# Patient Record
Sex: Female | Born: 1995 | Race: White | Hispanic: No | Marital: Single | Smoking: Never smoker
Health system: Southern US, Community
[De-identification: ages and names within clinical notes are randomized; demographics above are authoritative.]

## PROBLEM LIST (undated history)

## (undated) DIAGNOSIS — K219 Gastro-esophageal reflux disease without esophagitis: Secondary | ICD-10-CM

## (undated) DIAGNOSIS — F419 Anxiety disorder, unspecified: Secondary | ICD-10-CM

## (undated) DIAGNOSIS — F32A Depression, unspecified: Secondary | ICD-10-CM

## (undated) DIAGNOSIS — R519 Headache, unspecified: Secondary | ICD-10-CM

## (undated) DIAGNOSIS — F329 Major depressive disorder, single episode, unspecified: Secondary | ICD-10-CM

## (undated) DIAGNOSIS — R7303 Prediabetes: Secondary | ICD-10-CM

## (undated) DIAGNOSIS — R51 Headache: Secondary | ICD-10-CM

## (undated) HISTORY — DX: Prediabetes: R73.03

---
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
## 2017-08-05 ENCOUNTER — Emergency Department (HOSPITAL_BASED_OUTPATIENT_CLINIC_OR_DEPARTMENT_OTHER)
Admission: EM | Admit: 2017-08-05 | Discharge: 2017-08-05 | Disposition: A | Discharge status: Home / Self Care | Payer: Managed Care, Other (non HMO) | Attending: Emergency Medicine | Admitting: Emergency Medicine

## 2017-08-05 ENCOUNTER — Emergency Department (HOSPITAL_BASED_OUTPATIENT_CLINIC_OR_DEPARTMENT_OTHER): Payer: Managed Care, Other (non HMO)

## 2017-08-05 ENCOUNTER — Encounter (HOSPITAL_BASED_OUTPATIENT_CLINIC_OR_DEPARTMENT_OTHER): Payer: Self-pay | Admitting: Emergency Medicine

## 2017-08-05 ENCOUNTER — Other Ambulatory Visit: Payer: Self-pay

## 2017-08-05 DIAGNOSIS — Z79899 Other long term (current) drug therapy: Secondary | ICD-10-CM | POA: Diagnosis not present

## 2017-08-05 DIAGNOSIS — R1031 Right lower quadrant pain: Secondary | ICD-10-CM | POA: Diagnosis present

## 2017-08-05 DIAGNOSIS — F419 Anxiety disorder, unspecified: Secondary | ICD-10-CM

## 2017-08-05 HISTORY — DX: Depression, unspecified: F32.A

## 2017-08-05 HISTORY — DX: Anxiety disorder, unspecified: F41.9

## 2017-08-05 HISTORY — DX: Major depressive disorder, single episode, unspecified: F32.9

## 2017-08-05 LAB — URINALYSIS, ROUTINE W REFLEX MICROSCOPIC
Bilirubin Urine: NEGATIVE
GLUCOSE, UA: NEGATIVE mg/dL
HGB URINE DIPSTICK: NEGATIVE
Ketones, ur: NEGATIVE mg/dL
Leukocytes, UA: NEGATIVE
Nitrite: NEGATIVE
PROTEIN: NEGATIVE mg/dL
Specific Gravity, Urine: >1.03 — ABNORMAL HIGH (ref 1.005–1.030)
pH: 5.5 (ref 5.0–8.0)

## 2017-08-05 LAB — CBC WITH DIFFERENTIAL/PLATELET
Basophils Absolute: 0 10*3/uL (ref 0.0–0.1)
Basophils Relative: 0 %
Eosinophils Absolute: 0.1 10*3/uL (ref 0.0–0.7)
Eosinophils Relative: 1 %
HEMATOCRIT: 41.3 % (ref 36.0–46.0)
HEMOGLOBIN: 14 g/dL (ref 12.0–15.0)
LYMPHS ABS: 2.8 10*3/uL (ref 0.7–4.0)
LYMPHS PCT: 33 %
MCH: 30.3 pg (ref 26.0–34.0)
MCHC: 33.9 g/dL (ref 30.0–36.0)
MCV: 89.4 fL (ref 78.0–100.0)
MONOS PCT: 6 %
Monocytes Absolute: 0.5 10*3/uL (ref 0.1–1.0)
Neutro Abs: 5 10*3/uL (ref 1.7–7.7)
Neutrophils Relative %: 60 %
Platelets: 299 10*3/uL (ref 150–400)
RBC: 4.62 MIL/uL (ref 3.87–5.11)
RDW: 13.1 % (ref 11.5–15.5)
WBC: 8.4 10*3/uL (ref 4.0–10.5)

## 2017-08-05 LAB — COMPREHENSIVE METABOLIC PANEL
ALBUMIN: 4 g/dL (ref 3.5–5.0)
ALK PHOS: 98 U/L (ref 38–126)
ALT: 21 U/L (ref 0–44)
AST: 24 U/L (ref 15–41)
Anion gap: 9 (ref 5–15)
BUN: 8 mg/dL (ref 6–20)
CALCIUM: 8.9 mg/dL (ref 8.9–10.3)
CO2: 27 mmol/L (ref 22–32)
CREATININE: 0.62 mg/dL (ref 0.44–1.00)
Chloride: 102 mmol/L (ref 98–111)
GFR calc Af Amer: >60 mL/min (ref >60)
GFR calc non Af Amer: >60 mL/min (ref >60)
GLUCOSE: 98 mg/dL (ref 70–99)
POTASSIUM: 3.7 mmol/L (ref 3.5–5.1)
Sodium: 138 mmol/L (ref 135–145)
TOTAL PROTEIN: 7.7 g/dL (ref 6.5–8.1)
Total Bilirubin: 0.3 mg/dL (ref 0.3–1.2)

## 2017-08-05 LAB — LIPASE, BLOOD: Lipase: 31 U/L (ref 11–51)

## 2017-08-05 MED ORDER — DICYCLOMINE HCL 20 MG PO TABS: 20.0000 mg | ORAL_TABLET | Freq: Two times a day (BID) | ORAL | 0 refills | Status: DC

## 2017-08-05 MED ORDER — DIPHENHYDRAMINE HCL 50 MG/ML IJ SOLN
25.0000 mg | Freq: Once | INTRAMUSCULAR | Status: AC
  Administered 2017-08-05: 25 mg via INTRAVENOUS
  Filled 2017-08-05: qty 1

## 2017-08-05 MED ORDER — IOPAMIDOL (ISOVUE-300) INJECTION 61%
100.0000 mL | Freq: Once | INTRAVENOUS | Status: AC | PRN
  Administered 2017-08-05: 100 mL via INTRAVENOUS

## 2017-08-05 MED ORDER — ONDANSETRON 4 MG PO TBDP: 4.0000 mg | ORAL_TABLET | Freq: Three times a day (TID) | ORAL | 0 refills | Status: DC | PRN

## 2017-08-05 NOTE — ED Provider Notes (Signed)
[facility] EMERGENCY DEPARTMENT Provider Note   CSN: [id number] Arrival date & time: 08/05/17  1808     History   Chief Complaint Chief Complaint  Patient presents with  . Abdominal Pain    HPI Susan Brennan is a 22 y.o. female.  22 y/o female sent in by her PCP with a chief complaint of abdominal pain that began 2 days ago. Patient describes this pain as sharp mainly on the RLQ. She also reports some nausea, diarrhea and some anorexia. Patient states she's been eating less, last meal reported was 1pm. She states she's had diarrhea the last couple of days. Patient has tried ibuprfen for the pain but has had no relieve in symptoms. She denies any chest pain, shortness of breath, urinary or gynecological complaints.      Past Medical History:  Diagnosis Date  . Anxiety   . Depression   . HA (headache)     Patient Active Problem List   Diagnosis Date Noted  . Anxiety     No past surgical history on file.   OB History   None      Home Medications    Prior to Admission medications   Medication Sig Start Date End Date Taking? Authorizing Provider  cholecalciferol (VITAMIN D) 400 units TABS tablet Take 400 Units by mouth.   Yes [provider]  DULoxetine (CYMBALTA) 30 MG capsule Take 30 mg by mouth daily.   Yes [provider]  LORazepam (ATIVAN) 1 MG tablet Take 1 mg by mouth 2 (two) times daily.   Yes [provider]  zolpidem (AMBIEN) 10 MG tablet Take 10 mg by mouth at bedtime as needed for sleep.   Yes [provider]    Family History No family history on file.  Social History Social History   Tobacco Use  . Smoking status: Never Smoker  . Smokeless tobacco: Never Used  Substance Use Topics  . Alcohol use: No  . Drug use: No     Allergies   Patient has no known allergies.   Review of Systems Review of Systems  Constitutional: Negative for chills and fever.  HENT: Negative for rhinorrhea and  sore throat.   Respiratory: Negative for shortness of breath and wheezing.   Cardiovascular: Negative for chest pain and palpitations.  Gastrointestinal: Positive for abdominal pain, diarrhea and nausea. Negative for constipation and vomiting.  Genitourinary: Negative for dysuria and flank pain.  Musculoskeletal: Negative for back pain.  Skin: Negative for color change and rash.  Neurological: Negative for weakness and light-headedness.  All other systems reviewed and are negative.    Physical Exam Updated Vital Signs BP (!) 114/94 (BP Location: Left Arm)   Pulse (!) 103   Temp 98.3 F (36.8 C) (Oral)   Resp 20   Ht 5\' 8"  (1.727 m)   Wt (!) 169.6 kg (374 lb)   LMP 06/15/2017   SpO2 100%   BMI 56.87 kg/m   Physical Exam  Constitutional: She appears well-developed and well-nourished.  HENT:  Head: Normocephalic and atraumatic.  Eyes: Pupils are equal, round, and reactive to light.  Cardiovascular: Normal rate.  Pulmonary/Chest: Effort normal and breath sounds normal. She has no wheezes.  Abdominal: Soft. Normal appearance. She exhibits no distension. Bowel sounds are decreased. There is tenderness in the right lower quadrant and periumbilical area. There is tenderness at McBurney's point. There is no rigidity, no guarding, no CVA tenderness and negative Murphy's sign.    (+)  psoas sign  Skin: Skin is warm and dry.  Nursing note and vitals reviewed.    ED Treatments / Results  Labs (all labs ordered are listed, but only abnormal results are displayed) Labs Reviewed  COMPREHENSIVE METABOLIC PANEL  URINALYSIS, ROUTINE W REFLEX MICROSCOPIC    EKG None  Radiology No results found.  Procedures Procedures (including critical care time)  Medications Ordered in ED Medications - No data to display   Initial Impression / Assessment and Plan / ED Course  I have reviewed the triage vital signs and the nursing notes.  Pertinent labs & imaging results that were  available during my care of the patient were reviewed by me and considered in my medical decision making (see chart for details).     Patient sent from PCP for evaluation of appendicitis.Laboratory results revealed no leukocytosis.UA showed no infection. Lipase returned 31,I believe any gallbladder pathology is less likely patient not complaining of pain the RUQ.I have ordered a CT ABD &Pelvis to evaluate for appendicitis.   8:03 PM I have spoke to Dr. Gray Bernhardt about this patient, she has seen this patient at this time care has been transferred.   Final Clinical Impressions(s) / ED Diagnoses   Final diagnoses:  Right lower quadrant abdominal pain    ED Discharge Orders    None       Freddy Jaksch 08/05/17 2004    Jacalyn Lefevre, MD 08/05/17 2129

## 2017-08-05 NOTE — ED Notes (Signed)
Patient co generalized itching; no rash noted; no sob noted; nad noted; edp aware.

## 2017-08-05 NOTE — ED Triage Notes (Signed)
RLQ abdominal pain for several days.  No appetite.  Some nausea, vomited scant amount.  Some diarrhea.  Seen at Dr. Pablo LawrenceFried's office and was sent here for evaluation.  Pt had negative pregnancy test at office.  No other testing done.

## 2018-02-04 ENCOUNTER — Other Ambulatory Visit (HOSPITAL_COMMUNITY): Payer: Self-pay | Admitting: Psychiatry

## 2018-03-15 ENCOUNTER — Ambulatory Visit: Payer: Managed Care, Other (non HMO)

## 2018-03-15 ENCOUNTER — Encounter: Payer: Managed Care, Other (non HMO) | Admitting: Podiatry

## 2018-03-15 DIAGNOSIS — M722 Plantar fascial fibromatosis: Secondary | ICD-10-CM

## 2018-03-15 NOTE — Progress Notes (Signed)
This encounter was created in error - please disregard.

## 2018-03-24 ENCOUNTER — Ambulatory Visit: Payer: Managed Care, Other (non HMO) | Admitting: Podiatry

## 2018-04-11 ENCOUNTER — Other Ambulatory Visit: Payer: Self-pay

## 2018-04-11 ENCOUNTER — Observation Stay (HOSPITAL_BASED_OUTPATIENT_CLINIC_OR_DEPARTMENT_OTHER)
Admission: EM | Admit: 2018-04-11 | Discharge: 2018-04-13 | Disposition: A | Discharge status: Home / Self Care | Payer: Managed Care, Other (non HMO) | Attending: General Surgery | Admitting: General Surgery

## 2018-04-11 ENCOUNTER — Encounter (HOSPITAL_BASED_OUTPATIENT_CLINIC_OR_DEPARTMENT_OTHER): Payer: Self-pay | Admitting: Emergency Medicine

## 2018-04-11 ENCOUNTER — Emergency Department (HOSPITAL_BASED_OUTPATIENT_CLINIC_OR_DEPARTMENT_OTHER): Payer: Managed Care, Other (non HMO)

## 2018-04-11 DIAGNOSIS — R109 Unspecified abdominal pain: Secondary | ICD-10-CM

## 2018-04-11 DIAGNOSIS — Z79899 Other long term (current) drug therapy: Secondary | ICD-10-CM | POA: Diagnosis not present

## 2018-04-11 DIAGNOSIS — Z7984 Long term (current) use of oral hypoglycemic drugs: Secondary | ICD-10-CM | POA: Insufficient documentation

## 2018-04-11 DIAGNOSIS — F419 Anxiety disorder, unspecified: Secondary | ICD-10-CM | POA: Diagnosis not present

## 2018-04-11 DIAGNOSIS — K819 Cholecystitis, unspecified: Secondary | ICD-10-CM

## 2018-04-11 DIAGNOSIS — F329 Major depressive disorder, single episode, unspecified: Secondary | ICD-10-CM | POA: Diagnosis not present

## 2018-04-11 DIAGNOSIS — Z6841 Body Mass Index (BMI) 40.0 and over, adult: Secondary | ICD-10-CM | POA: Diagnosis not present

## 2018-04-11 DIAGNOSIS — K8010 Calculus of gallbladder with chronic cholecystitis without obstruction: Principal | ICD-10-CM | POA: Diagnosis present

## 2018-04-11 LAB — CBC WITH DIFFERENTIAL/PLATELET
Abs Immature Granulocytes: 0.01 10*3/uL (ref 0.00–0.07)
Basophils Absolute: 0 10*3/uL (ref 0.0–0.1)
Basophils Relative: 1 %
Eosinophils Absolute: 0.1 10*3/uL (ref 0.0–0.5)
Eosinophils Relative: 2 %
HEMATOCRIT: 42.1 % (ref 36.0–46.0)
Hemoglobin: 13.5 g/dL (ref 12.0–15.0)
Immature Granulocytes: 0 %
LYMPHS ABS: 2.6 10*3/uL (ref 0.7–4.0)
Lymphocytes Relative: 40 %
MCH: 29.7 pg (ref 26.0–34.0)
MCHC: 32.1 g/dL (ref 30.0–36.0)
MCV: 92.7 fL (ref 80.0–100.0)
Monocytes Absolute: 0.4 10*3/uL (ref 0.1–1.0)
Monocytes Relative: 5 %
Neutro Abs: 3.4 10*3/uL (ref 1.7–7.7)
Neutrophils Relative %: 52 %
Platelets: 286 10*3/uL (ref 150–400)
RBC: 4.54 MIL/uL (ref 3.87–5.11)
RDW: 12.6 % (ref 11.5–15.5)
WBC: 6.5 10*3/uL (ref 4.0–10.5)
nRBC: 0 % (ref 0.0–0.2)

## 2018-04-11 LAB — URINALYSIS, MICROSCOPIC (REFLEX)

## 2018-04-11 LAB — URINALYSIS, ROUTINE W REFLEX MICROSCOPIC
Bilirubin Urine: NEGATIVE
Glucose, UA: NEGATIVE mg/dL
Hgb urine dipstick: NEGATIVE
Ketones, ur: NEGATIVE mg/dL
Nitrite: NEGATIVE
Protein, ur: NEGATIVE mg/dL
Specific Gravity, Urine: 1.015 (ref 1.005–1.030)
pH: 6 (ref 5.0–8.0)

## 2018-04-11 LAB — COMPREHENSIVE METABOLIC PANEL
ALBUMIN: 3.8 g/dL (ref 3.5–5.0)
ALT: 23 U/L (ref 0–44)
AST: 23 U/L (ref 15–41)
Alkaline Phosphatase: 88 U/L (ref 38–126)
Anion gap: 6 (ref 5–15)
BUN: 12 mg/dL (ref 6–20)
CO2: 27 mmol/L (ref 22–32)
CREATININE: 0.69 mg/dL (ref 0.44–1.00)
Calcium: 9.4 mg/dL (ref 8.9–10.3)
Chloride: 104 mmol/L (ref 98–111)
GFR calc Af Amer: >60 mL/min (ref >60)
GFR calc non Af Amer: >60 mL/min (ref >60)
Glucose, Bld: 97 mg/dL (ref 70–99)
Potassium: 3.7 mmol/L (ref 3.5–5.1)
Sodium: 137 mmol/L (ref 135–145)
Total Bilirubin: 0.2 mg/dL — ABNORMAL LOW (ref 0.3–1.2)
Total Protein: 7.2 g/dL (ref 6.5–8.1)

## 2018-04-11 LAB — LIPASE, BLOOD: Lipase: 36 U/L (ref 11–51)

## 2018-04-11 LAB — PREGNANCY, URINE: Preg Test, Ur: NEGATIVE

## 2018-04-11 MED ORDER — ALUM & MAG HYDROXIDE-SIMETH 200-200-20 MG/5ML PO SUSP
30.0000 mL | Freq: Once | ORAL | Status: AC
  Administered 2018-04-11: 30 mL via ORAL
  Filled 2018-04-11: qty 30

## 2018-04-11 MED ORDER — SODIUM CHLORIDE 0.9 % IV BOLUS
1000.0000 mL | Freq: Once | INTRAVENOUS | Status: AC
  Administered 2018-04-11: 1000 mL via INTRAVENOUS

## 2018-04-11 MED ORDER — METFORMIN HCL ER 750 MG PO TB24
750.0000 mg | ORAL_TABLET | Freq: Every day | ORAL | Status: DC
  Administered 2018-04-13: 750 mg via ORAL
  Filled 2018-04-11 (×2): qty 1

## 2018-04-11 MED ORDER — SODIUM CHLORIDE 0.9 % IV SOLN
2.0000 g | Freq: Once | INTRAVENOUS | Status: AC
  Administered 2018-04-11: 2 g via INTRAVENOUS
  Filled 2018-04-11: qty 20

## 2018-04-11 MED ORDER — ONDANSETRON 4 MG PO TBDP
4.0000 mg | ORAL_TABLET | Freq: Three times a day (TID) | ORAL | Status: DC | PRN
  Administered 2018-04-11 – 2018-04-13 (×4): 4 mg via ORAL
  Filled 2018-04-11 (×4): qty 1

## 2018-04-11 MED ORDER — TRAMADOL HCL 50 MG PO TABS
50.0000 mg | ORAL_TABLET | Freq: Four times a day (QID) | ORAL | Status: DC | PRN
  Administered 2018-04-11: 50 mg via ORAL
  Filled 2018-04-11: qty 1

## 2018-04-11 MED ORDER — MORPHINE SULFATE (PF) 2 MG/ML IV SOLN
1.0000 mg | INTRAVENOUS | Status: DC | PRN
  Administered 2018-04-12: 1 mg via INTRAVENOUS
  Administered 2018-04-12 (×2): 2 mg via INTRAVENOUS
  Filled 2018-04-11 (×4): qty 1

## 2018-04-11 MED ORDER — POTASSIUM CHLORIDE IN NACL 20-0.45 MEQ/L-% IV SOLN
INTRAVENOUS | Status: DC
  Administered 2018-04-11 – 2018-04-12 (×3): via INTRAVENOUS
  Filled 2018-04-11 (×3): qty 1000

## 2018-04-11 MED ORDER — HYDROCODONE-ACETAMINOPHEN 5-325 MG PO TABS: 1.0000 | ORAL_TABLET | ORAL | Status: DC | PRN

## 2018-04-11 MED ORDER — ENOXAPARIN SODIUM 40 MG/0.4ML ~~LOC~~ SOLN
40.0000 mg | SUBCUTANEOUS | Status: DC
  Administered 2018-04-11: 40 mg via SUBCUTANEOUS
  Filled 2018-04-11: qty 0.4

## 2018-04-11 MED ORDER — DULOXETINE HCL 30 MG PO CPEP
30.0000 mg | ORAL_CAPSULE | Freq: Every day | ORAL | Status: DC
  Administered 2018-04-11 – 2018-04-13 (×3): 30 mg via ORAL
  Filled 2018-04-11 (×3): qty 1

## 2018-04-11 MED ORDER — ZOLPIDEM TARTRATE 5 MG PO TABS: 5.0000 mg | ORAL_TABLET | Freq: Every evening | ORAL | Status: DC | PRN

## 2018-04-11 MED ORDER — ZOLPIDEM TARTRATE 10 MG PO TABS: 5.0000 mg | ORAL_TABLET | Freq: Every evening | ORAL | Status: DC | PRN

## 2018-04-11 MED ORDER — LIDOCAINE VISCOUS HCL 2 % MT SOLN
15.0000 mL | Freq: Once | OROMUCOSAL | Status: AC
  Administered 2018-04-11: 15 mL via ORAL
  Filled 2018-04-11: qty 15

## 2018-04-11 MED ORDER — SODIUM CHLORIDE 0.9 % IV SOLN
2.0000 g | INTRAVENOUS | Status: DC
  Administered 2018-04-12: 2 g via INTRAVENOUS
  Filled 2018-04-11: qty 2

## 2018-04-11 NOTE — H&P (Addendum)
Re:   Susan Brennan DOB:   November 08, 1995 MRN:   [id number]  Chief Complaint Abdominal pain  ASSESEMENT AND PLAN: 1.  Cholecystitis, cholelithiasis  I discussed with the patient the indications and risks of gall bladder surgery.  The primary risks of gall bladder surgery include, but are not limited to, bleeding, infection, common bile duct injury, and open surgery.  There is also the risk that the patient may have continued symptoms after surgery.  We discussed the typical post-operative recovery course. I tried to answer the patient's questions.  I gave the patient literature about gall bladder surgery.  Plan admission, IVF, antibiotics, and surgery.  Dr. Sheliah Hatch is our surgeon of the week and will decide on the timing of surgery tomorrow.  2.  Anxiety/Depression 3.  Right foot issues - not defined 4.  Morbid obesty  Weight 382, BMI - 58.1  She is on Adipex - but not sure how much that has helped.  Chief Complaint  Patient presents with  . Abdominal Pain   PHYSICIAN REQUESTING CONSULTATION:  Lyndel Safe, PA, [facility]  HISTORY OF PRESENT ILLNESS: Susan Brennan is a 23 y.o. (DOB: [birth year])  white female whose primary care physician is Brennan, Susan Cella, [employer] .  Her brother, Susan Brennan, is in the room with her.   The patient has had some vague reflux, nausea, epigastric pain going on for about 1 week.  Over the counter medication has not helped this.  She said about 3 years ago she was evaluated for possible gallbladder disease by gastroenterologist, but her evaluation at that time was negative.  She does not remember his name. (Dr. Tiburcio Pea was the ordering physician in 2016 for an [place]) She has had no prior stomach, liver, pancreas, or colon disease.  She has had no prior abdominal surgery.  She has not spoken to her PCP about her symptoms.  She had a CT scan of the abdomen on 08/05/2017 which was negative. She had an abdominal US on 04/11/2018 which showed  moderate cholelithiasis with borderline wall thickening and suggestion of positive sonographic Murphy sign.   Past Medical History:  Diagnosis Date  . Anxiety   . Depression   . HA (headache)      History reviewed. No pertinent surgical history.    Current Facility-Administered Medications  Medication Dose Route Frequency Provider Last Rate Last Dose  . cefTRIAXone (ROCEPHIN) 2 g in sodium chloride 0.9 % 100 mL IVPB  2 g Intravenous Once Cristina Gong, PA-C   Stopped at 04/11/18 1400   Current Outpatient Medications  Medication Sig Dispense Refill  . DULoxetine (CYMBALTA) 30 MG capsule Take 30 mg by mouth daily.    . metFORMIN (GLUCOPHAGE-XR) 750 MG 24 hr tablet     . zolpidem (AMBIEN) 10 MG tablet Take 10 mg by mouth at bedtime as needed for sleep.    . cholecalciferol (VITAMIN D) 400 units TABS tablet Take 400 Units by mouth.    . dicyclomine (BENTYL) 20 MG tablet Take 1 tablet (20 mg total) by mouth 2 (two) times daily. 20 tablet 0  . LORazepam (ATIVAN) 1 MG tablet Take 1 mg by mouth 2 (two) times daily.    . medroxyPROGESTERone (PROVERA) 5 MG tablet     . ondansetron (ZOFRAN ODT) 4 MG disintegrating tablet Take 1 tablet (4 mg total) by mouth every 8 (eight) hours as needed. 10 tablet 0  . phentermine (ADIPEX-P) 37.5 MG tablet TAKE 1 TABLET BY MOUTH  EVERY DAY IN THE MORNING        Allergies  Allergen Reactions  . Contrast Media [Iodinated Diagnostic Agents] Itching and Rash    REVIEW OF SYSTEMS: Skin:  No history of rash.  No history of abnormal moles. Infection:  No history of hepatitis or HIV.  No history of MRSA. Neurologic:  No history of stroke.  No history of seizure.  No history of headaches. Cardiac:  No history of hypertension. No history of heart disease.  No history of seeing a cardiologist. Pulmonary:  Does not smoke cigarettes.  No asthma or bronchitis.  No OSA/CPAP.  Endocrine:  No diabetes. She said that her thyroid test were negative.  Gastrointestinal:  See HPI Musculoskeletal:  No history of joint or back disease. Hematologic:  No bleeding disorder.  No history of anemia.  Not anticoagulated. Psycho-social:  The patient is oriented.   She has a history of anxiety and depression.  She was seeing someone, but she cannot remember his name.  She is not seeing him now, but is thinking about going back.  SOCIAL and FAMILY HISTORY: Unmarried. She lives with her parents. She is engaged to [employer] (no date).       Her brother, Susan Brennan, is with her  PHYSICAL EXAM: BP (!) 118/50 (BP Location: Left Arm)   Pulse 97   Temp 97.6 F (36.4 C) (Oral)   Resp 19   Ht 5\' 8"  (1.727 m)   Wt (!) 173.3 kg   LMP 03/12/2018   SpO2 100%   BMI 58.08 kg/m   General: Morbidly obese WF who is alert and generally healthy appearing.  Skin:  Inspection and palpation - no mass or rash. Eyes:  Conjunctiva and lids unremarkable.            Pupils are equal Ears, Nose, Mouth, and Throat:  Ears and nose unremarkable            Lips and teeth are unremarable. Neck: Supple. No mass, trachea midline.  No thyroid mass. Lymph Nodes:  No supraclavicular, cervical, or inguinal nodes. Lungs: Normal respiratory effort.  Clear to auscultation and symmetric breath sounds. Heart:  Palpation of the heart is normal.            Auscultation: RRR. No murmur or rub.  Abdomen: Soft. No mass. No hernia. Normal bowel sounds.  No abdominal scars.  Obese.   Sore in RUQ quadrant without peritoneal sxes. Rectal: Not done. Musculoskeletal:  Normal gait.            Good muscle strength and ROM  in upper and lower extremities.  Neurologic:  Grossly intact to motor and sensory function. Psychiatric: Normal judgement and insight. Behavior is normal.            Oriented to time, person, place.   DATA REVIEWED, COUNSELING AND COORDINATION OF CARE: Epic notes reviewed. Counseling and coordination of care exceeded more than 50% of the time spent with  patient. Total time spent with patient and charting: 45 mintues  Ovidio Kin, MD,  [facility], [place] [address] [place]  [address]   [place], [place]    [address] Phone:  [phone] FAX:  [phone]

## 2018-04-11 NOTE — ED Triage Notes (Signed)
RUQ pain with nausea and diarrhea x 1 week. Sent from [facility]

## 2018-04-11 NOTE — ED Provider Notes (Signed)
  Physical Exam  BP (!) 118/50 (BP Location: Left Arm)   Pulse 97   Temp 97.6 F (36.4 C) (Oral)   Resp 19   Ht 5\' 8"  (1.727 m)   Wt (!) 173.3 kg   LMP 03/12/2018   SpO2 100%   BMI 58.08 kg/m   Physical Exam  GEN: Appears nontoxic Pulm: Clear lung sounds bilaterally CV: Regular rate rhythm Abdomen: Tenderness palpation of right upper quadrant without rigidity, guarding, distention.  Negative rebound.  ED Course/Procedures   Clinical Course as of Apr 10 1436  Sun Apr 11, 2018  1245 Spoke with on call surgery Dr. Ezzard Standing who requested ED to ED transfer so he can evaluate patient at [facility].   [EH]  1246 Spoke with Dr. Deretha Emory and [facility] emergency room who accepts patient for transfer.   [EH]    Clinical Course User Index [EH] Cristina Gong, PA-C    Procedures  MDM   Patient transferred from [facility] for cholecystitis.  Please see previous notes for further history.  In brief, patient presenting with 4 to 5 days of right upper quadrant abdominal pain, nausea, and heartburn.  Work-up at [facility] revealed cholecystitis on ultrasound.  Patient transferred to [facility] for surgical evaluation. Consult placed to Dr. Ezzard Standing with general surgery.  Pt to be admitted by gen surgery.        Susan Apley, PA-C 04/11/18 1513    Susan Mulders, MD 04/14/18 2043

## 2018-04-11 NOTE — ED Provider Notes (Signed)
[facility] EMERGENCY DEPARTMENT Provider Note   CSN: [id number] Arrival date & time: 04/11/18  1043    History   Chief Complaint Chief Complaint  Patient presents with  . Abdominal Pain    HPI Susan Brennan is a 23 y.o. female with a past medical history of high insulin levels, anxiety, depression, who presents today for evaluation of right upper quadrant pain.  She reports that for approximately 1 week she has had nausea vomiting and diarrhea with right upper quadrant pain.  She reports frequently feeling like she is regurgitating.  She has been trying OTC GERD medicines without significant relief.  She denies any fevers or coughs at home.  She says that this feels different from her usual heartburn.  She has never had any abdominal surgeries before.  She denies any dysuria, increased frequency or urgency.  No vaginal discharge, spotting, or abnormal bleeding.  She has vomited once yesterday, had 3 episodes of diarrhea yesterday.     HPI  Past Medical History:  Diagnosis Date  . Anxiety   . Depression   . HA (headache)     Patient Active Problem List   Diagnosis Date Noted  . Anxiety     History reviewed. No pertinent surgical history.   OB History   No obstetric history on file.      Home Medications    Prior to Admission medications   Medication Sig Start Date End Date Taking? Authorizing Provider  DULoxetine (CYMBALTA) 30 MG capsule Take 30 mg by mouth daily.   Yes [provider]  metFORMIN (GLUCOPHAGE-XR) 750 MG 24 hr tablet  12/26/17  Yes [provider]  zolpidem (AMBIEN) 10 MG tablet Take 10 mg by mouth at bedtime as needed for sleep.   Yes [provider]  cholecalciferol (VITAMIN D) 400 units TABS tablet Take 400 Units by mouth.    [provider]  dicyclomine (BENTYL) 20 MG tablet Take 1 tablet (20 mg total) by mouth 2 (two) times daily. 08/05/17   Jacalyn Lefevre, MD  LORazepam (ATIVAN) 1 MG tablet  Take 1 mg by mouth 2 (two) times daily.    [provider]  medroxyPROGESTERone (PROVERA) 5 MG tablet  10/23/17   [provider]  ondansetron (ZOFRAN ODT) 4 MG disintegrating tablet Take 1 tablet (4 mg total) by mouth every 8 (eight) hours as needed. 08/05/17   Jacalyn Lefevre, MD  phentermine (ADIPEX-P) 37.5 MG tablet TAKE 1 TABLET BY MOUTH EVERY DAY IN THE MORNING 10/30/17   [provider]    Family History No family history on file.  Social History Social History   Tobacco Use  . Smoking status: Never Smoker  . Smokeless tobacco: Never Used  Substance Use Topics  . Alcohol use: No  . Drug use: No     Allergies   Contrast media [iodinated diagnostic agents]   Review of Systems Review of Systems  Constitutional: Negative for chills and fever.  HENT: Negative for congestion.   Respiratory: Negative for chest tightness and shortness of breath.   Cardiovascular: Negative for chest pain.  Gastrointestinal: Positive for abdominal pain, diarrhea, nausea and vomiting.  Genitourinary: Negative for dysuria.  Musculoskeletal: Negative for back pain and neck pain.  Skin: Negative for color change, rash and wound.  Neurological: Negative for weakness and headaches.  All other systems reviewed and are negative.    Physical Exam Updated Vital Signs BP 118/74   Pulse 96   Temp 98 F (36.7  C) (Oral)   Resp 16   Ht 5\' 8"  (1.727 m)   Wt (!) 173.3 kg   LMP 03/12/2018   SpO2 100%   BMI 58.08 kg/m   Physical Exam Vitals signs and nursing note reviewed.  Constitutional:      General: She is not in acute distress.    Appearance: She is well-developed. She is obese.  HENT:     Head: Normocephalic and atraumatic.  Eyes:     Conjunctiva/sclera: Conjunctivae normal.  Neck:     Musculoskeletal: Neck supple.  Cardiovascular:     Rate and Rhythm: Normal rate and regular rhythm.     Heart sounds: Normal heart sounds. No murmur.  Pulmonary:     Effort:  Pulmonary effort is normal. No respiratory distress.     Breath sounds: Normal breath sounds.  Abdominal:     General: Bowel sounds are normal.     Palpations: Abdomen is soft.     Tenderness: There is abdominal tenderness (Worse in RUQ) in the right upper quadrant and epigastric area. There is no right CVA tenderness, left CVA tenderness, guarding or rebound. Positive signs include Murphy's sign. Negative signs include McBurney's sign.     Hernia: No hernia is present.  Skin:    General: Skin is warm and dry.  Neurological:     General: No focal deficit present.     Mental Status: She is alert.  Psychiatric:        Mood and Affect: Mood normal.        Behavior: Behavior normal.      ED Treatments / Results  Labs (all labs ordered are listed, but only abnormal results are displayed) Labs Reviewed  COMPREHENSIVE METABOLIC PANEL - Abnormal; Notable for the following components:      Result Value   Total Bilirubin 0.2 (*)    All other components within normal limits  URINALYSIS, ROUTINE W REFLEX MICROSCOPIC - Abnormal; Notable for the following components:   Leukocytes,Ua SMALL (*)    All other components within normal limits  URINALYSIS, MICROSCOPIC (REFLEX) - Abnormal; Notable for the following components:   Bacteria, UA MANY (*)    All other components within normal limits  LIPASE, BLOOD  CBC WITH DIFFERENTIAL/PLATELET  PREGNANCY, URINE    EKG EKG Interpretation  Date/Time:  Sunday April 11 2018 12:29:03 EDT Ventricular Rate:  88 PR Interval:    QRS Duration: 96 QT Interval:  353 QTC Calculation: 428 R Axis:   47 Text Interpretation:  Sinus rhythm Low voltage, precordial leads RSR' in V1 or V2, probably normal variant Borderline T abnormalities, inferior leads Confirmed by Benjiman Core [phone]) on 04/11/2018 1:37:18 PM   Radiology US Abdomen Limited Ruq  Result Date: 04/11/2018 CLINICAL DATA:  Right upper quadrant/epigastric pain with nausea and vomiting 1  week. EXAM: ULTRASOUND ABDOMEN LIMITED RIGHT UPPER QUADRANT COMPARISON:  CT 08/05/2017 FINDINGS: Gallbladder: Moderate cholelithiasis with borderline wall thickening measuring 3.2 mm. Largest stone 1 cm. No adjacent free fluid. Positive tenderness directly over the gallbladder while scanning suggesting positive sonographic Murphy's sign. Common bile duct: Diameter: 4 mm. Liver: No focal lesion identified. Within normal limits in parenchymal echogenicity. Portal vein is patent on color Doppler imaging with normal direction of blood flow towards the liver. IMPRESSION: Moderate cholelithiasis with borderline wall thickening and suggestion of positive sonographic Murphy sign. Findings may be due to acute cholecystitis. Electronically Signed   By: Elberta Fortis M.D.   On: 04/11/2018 12:12    Procedures Procedures (  including critical care time)  Medications Ordered in ED Medications  cefTRIAXone (ROCEPHIN) 2 g in sodium chloride 0.9 % 100 mL IVPB (has no administration in time range)  sodium chloride 0.9 % bolus 1,000 mL (0 mLs Intravenous Stopped 04/11/18 1329)  alum & mag hydroxide-simeth (MAALOX/MYLANTA) 200-200-20 MG/5ML suspension 30 mL (30 mLs Oral Given 04/11/18 1156)    And  lidocaine (XYLOCAINE) 2 % viscous mouth solution 15 mL (15 mLs Oral Given 04/11/18 1156)     Initial Impression / Assessment and Plan / ED Course  I have reviewed the triage vital signs and the nursing notes.  Pertinent labs & imaging results that were available during my care of the patient were reviewed by me and considered in my medical decision making (see chart for details).  Clinical Course as of Apr 10 1356  Sun Apr 11, 2018  1245 Spoke with on call surgery Dr. Ezzard StandingNewman who requested ED to ED transfer so [facility].   [EH]  1246 [facility] who [facility] patient for transfer.   [EH]    Clinical Course User Index [EH] Cristina GongHammond, Nil Bolser W, PA-C       Patient presents today for evaluation of approximately 1 week of abdominal pain with nausea vomiting and diarrhea.  On exam she is tender to palpation in the right upper quadrant.  Labs are obtained and reviewed without significant electrolyte or hematologic derangements.  Patient is not pregnant.  Her sound was obtained showing concern for cholecystitis with moderate cholelithiasis and borderline wall thickening of the gallbladder.  I spoke with Dr. Ezzard StandingNewman on-call [facility] who requested the patient be transferred ED to ED so that he may evaluate her.  I [facility] with [facility] who [facility] the [facility] in transfer.  Rocephin was ordered for her cholelithiasis.  She was offered morphine and other pain medicines however declined while in the department.  She remained hemodynamically stable while in the department.  [facility].  Final Clinical Impressions(s) / ED Diagnoses   Final diagnoses:  Abdominal pain  Cholecystitis    ED Discharge Orders    None       Norman ClayHammond, Deniese Oberry W, PA-C 04/11/18 1359    Benjiman CorePickering, Nathan, MD 04/11/18 [phone]

## 2018-04-11 NOTE — ED Notes (Signed)
Waiting on Dr Ezzard Standing for surgical consult

## 2018-04-11 NOTE — ED Notes (Signed)
Assisted to bathroom to provide specimen

## 2018-04-11 NOTE — ED Notes (Signed)
Report provided to Darl Pikes, RN charge at [facility], Report provided to [facility] for transport

## 2018-04-12 ENCOUNTER — Observation Stay (HOSPITAL_COMMUNITY): Payer: Managed Care, Other (non HMO) | Admitting: Anesthesiology

## 2018-04-12 ENCOUNTER — Encounter (HOSPITAL_COMMUNITY)
Admission: EM | Disposition: A | Discharge status: Home / Self Care | Payer: Self-pay | Source: Home / Self Care | Attending: Emergency Medicine

## 2018-04-12 HISTORY — PX: CHOLECYSTECTOMY: SHX55

## 2018-04-12 LAB — HIV ANTIBODY (ROUTINE TESTING W REFLEX): HIV Screen 4th Generation wRfx: NONREACTIVE

## 2018-04-12 SURGERY — LAPAROSCOPIC CHOLECYSTECTOMY WITH INTRAOPERATIVE CHOLANGIOGRAM
Anesthesia: General | Site: Abdomen

## 2018-04-12 MED ORDER — FENTANYL CITRATE (PF) 100 MCG/2ML IJ SOLN
INTRAMUSCULAR | Status: AC
  Filled 2018-04-12: qty 2

## 2018-04-12 MED ORDER — IBUPROFEN 800 MG PO TABS
800.0000 mg | ORAL_TABLET | Freq: Three times a day (TID) | ORAL | Status: DC | PRN
  Administered 2018-04-12 – 2018-04-13 (×2): 800 mg via ORAL
  Filled 2018-04-12 (×2): qty 1

## 2018-04-12 MED ORDER — LIDOCAINE 2% (20 MG/ML) 5 ML SYRINGE
INTRAMUSCULAR | Status: DC | PRN
  Administered 2018-04-12: 80 mg via INTRAVENOUS

## 2018-04-12 MED ORDER — PROPOFOL 10 MG/ML IV BOLUS
INTRAVENOUS | Status: DC | PRN
  Administered 2018-04-12: 300 mg via INTRAVENOUS

## 2018-04-12 MED ORDER — DEXMEDETOMIDINE HCL 200 MCG/2ML IV SOLN
INTRAVENOUS | Status: DC | PRN
  Administered 2018-04-12: 12 ug via INTRAVENOUS
  Administered 2018-04-12: 8 ug via INTRAVENOUS

## 2018-04-12 MED ORDER — SODIUM CHLORIDE 0.9 % IR SOLN
Status: DC | PRN
  Administered 2018-04-12: 1

## 2018-04-12 MED ORDER — LIDOCAINE 2% (20 MG/ML) 5 ML SYRINGE
INTRAMUSCULAR | Status: AC
  Filled 2018-04-12: qty 5

## 2018-04-12 MED ORDER — LACTATED RINGERS IV SOLN
INTRAVENOUS | Status: DC | PRN
  Administered 2018-04-12: 12:00:00 via INTRAVENOUS

## 2018-04-12 MED ORDER — MIDAZOLAM HCL 2 MG/2ML IJ SOLN
INTRAMUSCULAR | Status: DC | PRN
  Administered 2018-04-12: 2 mg via INTRAVENOUS

## 2018-04-12 MED ORDER — SUGAMMADEX SODIUM 500 MG/5ML IV SOLN
INTRAVENOUS | Status: AC
  Filled 2018-04-12: qty 5

## 2018-04-12 MED ORDER — SUCCINYLCHOLINE CHLORIDE 200 MG/10ML IV SOSY
PREFILLED_SYRINGE | INTRAVENOUS | Status: AC
  Filled 2018-04-12: qty 10

## 2018-04-12 MED ORDER — BUPIVACAINE-EPINEPHRINE 0.25% -1:200000 IJ SOLN
INTRAMUSCULAR | Status: DC | PRN
  Administered 2018-04-12: 30 mL

## 2018-04-12 MED ORDER — FENTANYL CITRATE (PF) 100 MCG/2ML IJ SOLN: 25.0000 ug | INTRAMUSCULAR | Status: DC | PRN

## 2018-04-12 MED ORDER — DEXAMETHASONE SODIUM PHOSPHATE 10 MG/ML IJ SOLN
INTRAMUSCULAR | Status: DC | PRN
  Administered 2018-04-12: 8 mg via INTRAVENOUS

## 2018-04-12 MED ORDER — FENTANYL CITRATE (PF) 250 MCG/5ML IJ SOLN
INTRAMUSCULAR | Status: DC | PRN
  Administered 2018-04-12 (×8): 50 ug via INTRAVENOUS

## 2018-04-12 MED ORDER — DEXAMETHASONE SODIUM PHOSPHATE 10 MG/ML IJ SOLN
INTRAMUSCULAR | Status: AC
  Filled 2018-04-12: qty 1

## 2018-04-12 MED ORDER — FENTANYL CITRATE (PF) 100 MCG/2ML IJ SOLN
25.0000 ug | INTRAMUSCULAR | Status: DC | PRN
  Administered 2018-04-12 (×3): 50 ug via INTRAVENOUS

## 2018-04-12 MED ORDER — ONDANSETRON HCL 4 MG/2ML IJ SOLN
INTRAMUSCULAR | Status: AC
  Filled 2018-04-12: qty 2

## 2018-04-12 MED ORDER — PROPOFOL 10 MG/ML IV BOLUS
INTRAVENOUS | Status: AC
  Filled 2018-04-12: qty 20

## 2018-04-12 MED ORDER — ROCURONIUM BROMIDE 100 MG/10ML IV SOLN
INTRAVENOUS | Status: AC
  Filled 2018-04-12: qty 1

## 2018-04-12 MED ORDER — ROCURONIUM BROMIDE 10 MG/ML (PF) SYRINGE
PREFILLED_SYRINGE | INTRAVENOUS | Status: DC | PRN
  Administered 2018-04-12: 40 mg via INTRAVENOUS
  Administered 2018-04-12: 20 mg via INTRAVENOUS

## 2018-04-12 MED ORDER — MIDAZOLAM HCL 2 MG/2ML IJ SOLN
INTRAMUSCULAR | Status: AC
  Filled 2018-04-12: qty 2

## 2018-04-12 MED ORDER — SUGAMMADEX SODIUM 200 MG/2ML IV SOLN
INTRAVENOUS | Status: DC | PRN
  Administered 2018-04-12: 350 mg via INTRAVENOUS

## 2018-04-12 MED ORDER — ONDANSETRON HCL 4 MG/2ML IJ SOLN
INTRAMUSCULAR | Status: DC | PRN
  Administered 2018-04-12 (×2): 4 mg via INTRAVENOUS

## 2018-04-12 MED ORDER — SUCCINYLCHOLINE CHLORIDE 200 MG/10ML IV SOSY
PREFILLED_SYRINGE | INTRAVENOUS | Status: DC | PRN
  Administered 2018-04-12: 140 mg via INTRAVENOUS

## 2018-04-12 SURGICAL SUPPLY — 47 items
APPLIER CLIP ROT 10 11.4 M/L (STAPLE)
BANDAGE ADH SHEER 1  50/CT (GAUZE/BANDAGES/DRESSINGS) ×12 IMPLANT
BENZOIN TINCTURE PRP APPL 2/3 (GAUZE/BANDAGES/DRESSINGS) ×3 IMPLANT
CABLE HIGH FREQUENCY MONO STRZ (ELECTRODE) ×3 IMPLANT
CATH CHOLANG 76X19 KUMAR (CATHETERS) ×3 IMPLANT
CHLORAPREP W/TINT 26ML (MISCELLANEOUS) ×3 IMPLANT
CLIP APPLIE ROT 10 11.4 M/L (STAPLE) IMPLANT
CLIP VESOLOCK LG 6/CT PURPLE (CLIP) IMPLANT
CLIP VESOLOCK MED LG 6/CT (CLIP) ×3 IMPLANT
CLOSURE WOUND 1/2 X4 (GAUZE/BANDAGES/DRESSINGS) ×1
COVER MAYO STAND STRL (DRAPES) ×3 IMPLANT
COVER SURGICAL LIGHT HANDLE (MISCELLANEOUS) ×3 IMPLANT
COVER WAND RF STERILE (DRAPES) ×3 IMPLANT
DECANTER SPIKE VIAL GLASS SM (MISCELLANEOUS) ×3 IMPLANT
DERMABOND ADVANCED (GAUZE/BANDAGES/DRESSINGS) ×2
DERMABOND ADVANCED .7 DNX12 (GAUZE/BANDAGES/DRESSINGS) ×1 IMPLANT
DRAIN CHANNEL 19F RND (DRAIN) IMPLANT
DRAPE C-ARM 42X120 X-RAY (DRAPES) IMPLANT
EVACUATOR SILICONE 100CC (DRAIN) IMPLANT
GLOVE BIOGEL M 6.5 STRL (GLOVE) ×3 IMPLANT
GLOVE BIOGEL M 8.0 STRL (GLOVE) ×3 IMPLANT
GLOVE BIOGEL PI IND STRL 6 (GLOVE) ×1 IMPLANT
GLOVE BIOGEL PI IND STRL 7.0 (GLOVE) ×2 IMPLANT
GLOVE BIOGEL PI INDICATOR 6 (GLOVE) ×2
GLOVE BIOGEL PI INDICATOR 7.0 (GLOVE) ×4
GLOVE SURG SS PI 7.0 STRL IVOR (GLOVE) ×3 IMPLANT
GOWN STRL REUS W/TWL LRG LVL3 (GOWN DISPOSABLE) ×3 IMPLANT
GOWN STRL REUS W/TWL XL LVL3 (GOWN DISPOSABLE) ×6 IMPLANT
GRASPER SUT TROCAR 14GX15 (MISCELLANEOUS) IMPLANT
KIT BASIN OR (CUSTOM PROCEDURE TRAY) ×3 IMPLANT
KIT TURNOVER KIT A (KITS) ×3 IMPLANT
POUCH RETRIEVAL ECOSAC 10 (ENDOMECHANICALS) ×1 IMPLANT
POUCH RETRIEVAL ECOSAC 10MM (ENDOMECHANICALS) ×2
SCISSORS LAP 5X35 DISP (ENDOMECHANICALS) ×3 IMPLANT
SET IRRIG TUBING LAPAROSCOPIC (IRRIGATION / IRRIGATOR) ×3 IMPLANT
SET TUBE SMOKE EVAC HIGH FLOW (TUBING) ×3 IMPLANT
SLEEVE XCEL OPT CAN 5 100 (ENDOMECHANICALS) ×6 IMPLANT
STOPCOCK 4 WAY LG BORE MALE ST (IV SETS) IMPLANT
STRIP CLOSURE SKIN 1/2X4 (GAUZE/BANDAGES/DRESSINGS) ×2 IMPLANT
SUT ETHILON 2 0 PS N (SUTURE) IMPLANT
SUT MNCRL AB 4-0 PS2 18 (SUTURE) ×3 IMPLANT
SUT VICRYL 0 ENDOLOOP (SUTURE) IMPLANT
TOWEL OR 17X26 10 PK STRL BLUE (TOWEL DISPOSABLE) ×3 IMPLANT
TOWEL OR NON WOVEN STRL DISP B (DISPOSABLE) IMPLANT
TRAY LAPAROSCOPIC (CUSTOM PROCEDURE TRAY) ×3 IMPLANT
TROCAR BLADELESS OPT 5 100 (ENDOMECHANICALS) ×3 IMPLANT
TROCAR XCEL NON-BLD 11X100MML (ENDOMECHANICALS) ×3 IMPLANT

## 2018-04-12 NOTE — Anesthesia Procedure Notes (Signed)
Procedure Name: Intubation Date/Time: 04/12/2018 12:31 PM Performed by: Niel Hummer, CRNA Pre-anesthesia Checklist: Patient being monitored, Suction available, Emergency Drugs available and Patient identified Patient Re-evaluated:Patient Re-evaluated prior to induction Oxygen Delivery Method: Circle system utilized Preoxygenation: Pre-oxygenation with 100% oxygen Induction Type: IV induction, Rapid sequence and Cricoid Pressure applied Laryngoscope Size: Mac and 4 Grade View: Grade I Tube type: Oral Tube size: 7.0 mm Number of attempts: 1 Airway Equipment and Method: Stylet Placement Confirmation: ETT inserted through vocal cords under direct vision,  positive ETCO2 and breath sounds checked- equal and bilateral Secured at: 22 cm Tube secured with: Tape Dental Injury: Teeth and Oropharynx as per pre-operative assessment

## 2018-04-12 NOTE — Transfer of Care (Signed)
Immediate Anesthesia Transfer of Care Note  Patient: Susan Brennan  Procedure(s) Performed: LAPAROSCOPIC CHOLECYSTECTOMY WITH INTRAOPERATIVE CHOLANGIOGRAM (N/A Abdomen)  Patient Location: PACU  Anesthesia Type:General  Level of Consciousness: awake, alert  and oriented  Airway & Oxygen Therapy: Patient Spontanous Breathing and Patient connected to face mask oxygen  Post-op Assessment: Report given to RN and Post -op Vital signs reviewed and stable  Post vital signs: Reviewed and stable  Last Vitals:  Vitals Value Taken Time  BP 180/118 04/12/2018  1:55 PM  Temp    Pulse 81 04/12/2018  1:59 PM  Resp 22 04/12/2018  1:59 PM  SpO2 100 % 04/12/2018  1:59 PM  Vitals shown include unvalidated device data.  Last Pain:  Vitals:   04/12/18 1141  TempSrc: Oral  PainSc:          Complications: No apparent anesthesia complications

## 2018-04-12 NOTE — Anesthesia Preprocedure Evaluation (Signed)
Anesthesia Evaluation  Patient identified by MRN, date of birth, ID band Patient awake    Reviewed: Allergy & Precautions, NPO status , Patient's Chart, lab work & pertinent test results  Airway Mallampati: II  TM Distance: >3 FB Neck ROM: Full    Dental  (+) Dental Advisory Given   Pulmonary neg pulmonary ROS,    Pulmonary exam normal breath sounds clear to auscultation       Cardiovascular negative cardio ROS Normal cardiovascular exam Rhythm:Regular Rate:Normal     Neuro/Psych  Headaches, PSYCHIATRIC DISORDERS Anxiety Depression    GI/Hepatic negative GI ROS, Neg liver ROS,   Endo/Other  negative endocrine ROS  Renal/GU negative Renal ROS     Musculoskeletal negative musculoskeletal ROS (+)   Abdominal (+) + obese,   Peds  Hematology negative hematology ROS (+)   Anesthesia Other Findings   Reproductive/Obstetrics negative OB ROS                             Anesthesia Physical Anesthesia Plan  ASA: III  Anesthesia Plan: General   Post-op Pain Management:    Induction: Intravenous  PONV Risk Score and Plan: 4 or greater and Ondansetron, Dexamethasone, Midazolam, Scopolamine patch - Pre-op and Treatment may vary due to age or medical condition  Airway Management Planned: Oral ETT  Additional Equipment: None  Intra-op Plan:   Post-operative Plan: Extubation in OR  Informed Consent: I have reviewed the patients History and Physical, chart, labs and discussed the procedure including the risks, benefits and alternatives for the proposed anesthesia with the patient or authorized representative who has indicated his/her understanding and acceptance.     Dental advisory given  Plan Discussed with: CRNA  Anesthesia Plan Comments:         Anesthesia Quick Evaluation

## 2018-04-12 NOTE — Op Note (Signed)
PATIENT:  Susan Brennan  23 y.o. female  PRE-OPERATIVE DIAGNOSIS:  cholecystitis  POST-OPERATIVE DIAGNOSIS:  cholecystitis  PROCEDURE:  Procedure(s): LAPAROSCOPIC CHOLECYSTECTOMY  SURGEON:  Surgeon(s): , De Blanch, MD Berna Bue, MD  ASSISTANT: Phylliss Blakes  ANESTHESIA:   local and general  Indications for procedure: [name] is a 23 y.o. female with symptoms of Abdominal pain and Nausea and vomiting consistent with gallbladder disease, Confirmed by Ultrasound.  Description of procedure: The patient was brought into the operative suite, placed supine. Anesthesia was administered with endotracheal tube. Patient was strapped in place and foot board was secured. All pressure points were offloaded by foam padding. The patient was prepped and draped in the usual sterile fashion.  A small incision was made to the right of the umbilicus. A 74mm trocar was inserted into the peritoneal cavity with optical entry. Pneumoperitoneum was applied with high flow low pressure. 2 74mm trocars were placed in the RUQ. A 57mm trocar was placed in the subxiphoid space. Marcaine was infused to the subxiphoid space and lateral upper right abdomen in the transversus abdominis plane. Next the patient was placed in reverse trendelenberg. The gallbladder was fatty and inflamed.  The gallbladder was retracted cephalad and lateral. The peritoneum was reflected off the infundibulum working lateral to medial. The cystic duct and cystic artery were identified and further dissection revealed a critical view. The cystic duct and cystic artery were doubly clipped and ligated.   The gallbladder was removed off the liver bed with cautery. The Gallbladder was placed in a specimen bag. The gallbladder fossa was irrigated and hemostasis was applied with cautery. The gallbladder was removed via the 76mm trocar. The fascial defect was closed with interrupted 0 vicryl suture via laparoscopic trans-fascial  suture passer. Pneumoperitoneum was removed, all trocar were removed. All incisions were closed with 4-0 monocryl subcuticular stitch. The patient woke from anesthesia and was brought to PACU in stable condition. All counts were correct  Findings: acute cholecystitis  Specimen: gallbladder  Blood loss: 30 ml  Local anesthesia: 30 ml marcaine  Complications: none  PLAN OF CARE: Admit to inpatient   PATIENT DISPOSITION:  PACU - hemodynamically stable.  Feliciana Rossetti, M.D. General, Bariatric, & Minimally Invasive Surgery [facility]

## 2018-04-12 NOTE — Discharge Instructions (Signed)
° ° °Managing Your Pain After Surgery Without Opioids ° ° ° °Thank you for participating in our program to help patients manage their pain after surgery without opioids. This is part of our effort to provide you with the best care possible, without exposing you or your family to the risk that opioids pose. ° °What pain can I expect after surgery? °You can expect to have some pain after surgery. This is normal. The pain is typically worse the day after surgery, and quickly begins to get better. °Many studies have found that many patients are able to manage their pain after surgery with Over-the-Counter (OTC) medications such as Tylenol and Motrin. If you have a condition that does not allow you to take Tylenol or Motrin, notify your surgical team. ° °How will I manage my pain? °The best strategy for controlling your pain after surgery is around the clock pain control with Tylenol (acetaminophen) and Motrin (ibuprofen or Advil). Alternating these medications with each other allows you to maximize your pain control. In addition to Tylenol and Motrin, you can use heating pads or ice packs on your incisions to help reduce your pain. ° °How will I alternate your regular strength over-the-counter pain medication? °You will take a dose of pain medication every three hours. °; Start by taking 650 mg of Tylenol (2 pills of 325 mg) °; 3 hours later take 600 mg of Motrin (3 pills of 200 mg) °; 3 hours after taking the Motrin take 650 mg of Tylenol °; 3 hours after that take 600 mg of Motrin. ° ° °- 1 - ° °See example - if your first dose of Tylenol is at 12:00 PM ° ° °12:00 PM Tylenol 650 mg (2 pills of 325 mg)  °3:00 PM Motrin 600 mg (3 pills of 200 mg)  °6:00 PM Tylenol 650 mg (2 pills of 325 mg)  °9:00 PM Motrin 600 mg (3 pills of 200 mg)  °Continue alternating every 3 hours  ° °We recommend that you follow this schedule around-the-clock for at least 3 days after surgery, or until you feel that it is no longer needed. Use  the table on the last page of this handout to keep track of the medications you are taking. °Important: °Do not take more than 3000mg of Tylenol or 3200mg of Motrin in a 24-hour period. °Do not take ibuprofen/Motrin if you have a history of bleeding stomach ulcers, severe kidney disease, &/or actively taking a blood thinner ° °What if I still have pain? °If you have pain that is not controlled with the over-the-counter pain medications (Tylenol and Motrin or Advil) you might have what we call “breakthrough” pain. You will receive a prescription for a small amount of an opioid pain medication such as Oxycodone, Tramadol, or Tylenol with Codeine. Use these opioid pills in the first 24 hours after surgery if you have breakthrough pain. Do not take more than 1 pill every 4-6 hours. ° °If you still have uncontrolled pain after using all opioid pills, don't hesitate to call our staff using the number provided. We will help make sure you are managing your pain in the best way possible, and if necessary, we can provide a prescription for additional pain medication. ° ° °Day 1   ° °Time  °Name of Medication Number of pills taken  °Amount of Acetaminophen  °Pain Level  ° °Comments  °AM PM       °AM PM       °AM PM       °  AM PM       °AM PM       °AM PM       °AM PM       °AM PM       °Total Daily amount of Acetaminophen °Do not take more than  3,000 mg per day    ° ° °Day 2   ° °Time  °Name of Medication Number of pills °taken  °Amount of Acetaminophen  °Pain Level  ° °Comments  °AM PM       °AM PM       °AM PM       °AM PM       °AM PM       °AM PM       °AM PM       °AM PM       °Total Daily amount of Acetaminophen °Do not take more than  3,000 mg per day    ° ° °Day 3   ° °Time  °Name of Medication Number of pills taken  °Amount of Acetaminophen  °Pain Level  ° °Comments  °AM PM       °AM PM       °AM PM       °AM PM       ° ° ° °AM PM       °AM PM       °AM PM       °AM PM       °Total Daily amount of Acetaminophen °Do  not take more than  3,000 mg per day    ° ° °Day 4   ° °Time  °Name of Medication Number of pills taken  °Amount of Acetaminophen  °Pain Level  ° °Comments  °AM PM       °AM PM       °AM PM       °AM PM       °AM PM       °AM PM       °AM PM       °AM PM       °Total Daily amount of Acetaminophen °Do not take more than  3,000 mg per day    ° ° °Day 5   ° °Time  °Name of Medication Number °of pills taken  °Amount of Acetaminophen  °Pain Level  ° °Comments  °AM PM       °AM PM       °AM PM       °AM PM       °AM PM       °AM PM       °AM PM       °AM PM       °Total Daily amount of Acetaminophen °Do not take more than  3,000 mg per day    ° ° ° °Day 6   ° °Time  °Name of Medication Number of pills °taken  °Amount of Acetaminophen  °Pain Level  °Comments  °AM PM       °AM PM       °AM PM       °AM PM       °AM PM       °AM PM       °AM PM       °AM PM       °Total Daily amount of Acetaminophen °Do not take more than    3,000 mg per day    ° ° °Day 7   ° °Time  °Name of Medication Number of pills taken  °Amount of Acetaminophen  °Pain Level  ° °Comments  °AM PM       °AM PM       °AM PM       °AM PM       °AM PM       °AM PM       °AM PM       °AM PM       °Total Daily amount of Acetaminophen °Do not take more than  3,000 mg per day    ° ° ° ° °For additional information about how and where to safely dispose of unused opioid °medications - https://www.morepowerfulnc.org ° °Disclaimer: This document contains information and/or instructional materials adapted from [facility] for the typical patient with your condition. It does not replace medical advice from your health care provider because your experience may differ from that of the °typical patient. Talk to your health care provider if you have any questions about this °document, your condition or your treatment plan. °Adapted from [facility] ° °[facility] °LAPAROSCOPIC SURGERY: POST OP INSTRUCTIONS °Always review your discharge  instruction sheet given to you by the facility where your surgery was performed. °IF YOU HAVE DISABILITY OR FAMILY LEAVE FORMS, YOU MUST BRING THEM TO THE OFFICE FOR PROCESSING.   °DO NOT GIVE THEM TO YOUR DOCTOR. ° °PAIN CONTROL ° °1. First take acetaminophen (Tylenol) AND/or ibuprofen (Advil) to control your pain after surgery.  Follow directions on package.  Taking acetaminophen (Tylenol) and/or ibuprofen (Advil) regularly after surgery will help to control your pain and lower the amount of prescription pain medication you may need.  You should not take more than 3,000 mg (3 grams) of acetaminophen (Tylenol) in 24 hours.  You should not take ibuprofen (Advil), aleve, motrin, naprosyn or other NSAIDS if you have a history of stomach ulcers or chronic kidney disease.  °2. A prescription for pain medication may be given to you upon discharge.  Take your pain medication as prescribed, if you still have uncontrolled pain after taking acetaminophen (Tylenol) or ibuprofen (Advil). °3. Use ice packs to help control pain. °4. If you need a refill on your pain medication, please contact your pharmacy.  They will contact our office to request authorization. Prescriptions will not be filled after 5pm or on week-ends. ° °HOME MEDICATIONS °5. Take your usually prescribed medications unless otherwise directed. ° °DIET °6. You should follow a light diet the first few days after arrival home.  Be sure to include lots of fluids daily. Avoid fatty, fried foods.  ° °CONSTIPATION °7. It is common to experience some constipation after surgery and if you are taking pain medication.  Increasing fluid intake and taking a stool softener (such as Colace) will usually help or prevent this problem from occurring.  A mild laxative (Milk of Magnesia or Miralax) should be taken according to package instructions if there are no bowel movements after 48 hours. ° °WOUND/INCISION CARE °8. Most patients will experience some swelling and bruising in  the area of the incisions.  Ice packs will help.  Swelling and bruising can take several days to resolve.  °9. Unless discharge instructions indicate otherwise, follow guidelines below  °a. STERI-STRIPS - you may remove your outer bandages 48 hours after surgery, and you may shower at that time.  You have steri-strips (  small skin tapes) in place directly over the incision.  These strips should be left on the skin for 7-10 days.   °b. DERMABOND/SKIN GLUE - you may shower in 24 hours.  The glue will flake off over the next 2-3 weeks. °10. Any sutures or staples will be removed at the office during your follow-up visit. ° °ACTIVITIES °11. You may resume regular (light) daily activities beginning the next day--such as daily self-care, walking, climbing stairs--gradually increasing activities as tolerated.  You may have sexual intercourse when it is comfortable.  Refrain from any heavy lifting or straining until approved by your doctor. °a. You may drive when you are no longer taking prescription pain medication, you can comfortably wear a seatbelt, and you can safely maneuver your car and apply brakes. ° °FOLLOW-UP °12. You should see your doctor in the office for a follow-up appointment approximately 2-3 weeks after your surgery.  You should have been given your post-op/follow-up appointment when your surgery was scheduled.  If you did not receive a post-op/follow-up appointment, make sure that you call for this appointment within a day or two after you arrive home to insure a convenient appointment time. ° ° °WHEN TO CALL YOUR DOCTOR: °1. Fever over 101.0 °2. Inability to urinate °3. Continued bleeding from incision. °4. Increased pain, redness, or drainage from the incision. °5. Increasing abdominal pain ° °The clinic staff is available to answer your questions during regular business hours.  Please don’t hesitate to call and ask to speak to one of the nurses for clinical concerns.  If you have a medical emergency,  go to the nearest emergency room or call 911.  A surgeon from [facility] is always on call at the hospital. °[address], [address], [place], [place]  [address] ? [address] [address], [place], [place]   [address] °[phone] ? [phone] ? FAX [phone] °Web site: [email or web address] ° °

## 2018-04-12 NOTE — Progress Notes (Signed)
  Progress Note: General Surgery Service   Assessment/Plan: Active Problems:   Cholecystitis with cholelithiasis  23yo female with acute cholecystitis -lap chole -We discussed the etiology of her pain, we discussed treatment options and recommended surgery. We discussed details of surgery including general anesthesia, laparoscopic approach, identification of cystic duct and common bile duct. Ligation of cystic duct and cystic artery. Possible need for intraoperative cholangiogram or open procedure. Possible risks of common bile duct injury, liver injury, cystic duct leak, bleeding, infection, post-cholecystectomy syndrome. The patient showed good understanding and all questions were answered     LOS: 0 days  Chief Complaint/Subjective: Continued pain  Objective: Vital signs in last 24 hours: Temp:  [97.6 F (36.4 C)-98 F (36.7 C)] 97.6 F (36.4 C) (03/23 0522) Pulse Rate:  [80-100] 86 (03/23 0522) Resp:  [14-19] 16 (03/23 0522) BP: (116-155)/(50-86) 148/79 (03/23 0522) SpO2:  [98 %-100 %] 99 % (03/23 0522) Weight:  [173.3 kg] 173.3 kg (03/22 1049)    Intake/Output from previous day: 03/22 0701 - 03/23 0700 In: 1475.1 [I.V.:1475.1] Out: -  Intake/Output this shift: No intake/output data recorded.  Lungs: nonlabored breathing  Cardiovascular: RRR  Abd: soft, tenderness in epigastrium  Extremities: no edema  Neuro: AOx4  Lab Results: CBC  Recent Labs    04/11/18 1115  WBC 6.5  HGB 13.5  HCT 42.1  PLT 286   BMET Recent Labs    04/11/18 1115  NA 137  K 3.7  CL 104  CO2 27  GLUCOSE 97  BUN 12  CREATININE 0.69  CALCIUM 9.4   PT/INR No results for input(s): LABPROT, INR in the last 72 hours. ABG No results for input(s): PHART, HCO3 in the last 72 hours.  Invalid input(s): PCO2, PO2  Studies/Results:  Anti-infectives: Anti-infectives (From admission, onward)   Start     Dose/Rate Route Frequency Ordered Stop   04/12/18 1000  cefTRIAXone  (ROCEPHIN) 2 g in sodium chloride 0.9 % 100 mL IVPB     2 g 200 mL/hr over 30 Minutes Intravenous Every 24 hours 04/11/18 1735     04/11/18 1230  cefTRIAXone (ROCEPHIN) 2 g in sodium chloride 0.9 % 100 mL IVPB     2 g 200 mL/hr over 30 Minutes Intravenous  Once 04/11/18 1219 04/11/18 1402      Medications: Scheduled Meds: . DULoxetine  30 mg Oral Daily  . enoxaparin (LOVENOX) injection  40 mg Subcutaneous Q24H  . metFORMIN  750 mg Oral Q breakfast   Continuous Infusions: . 0.45 % NaCl with KCl 20 mEq / L 125 mL/hr at 04/12/18 0600  . cefTRIAXone (ROCEPHIN)  IV     PRN Meds:.HYDROcodone-acetaminophen, morphine injection, ondansetron, traMADol, zolpidem  Rodman Pickle, MD [facility]

## 2018-04-12 NOTE — Anesthesia Postprocedure Evaluation (Signed)
Anesthesia Post Note  Patient: Susan Brennan  Procedure(s) Performed: LAPAROSCOPIC CHOLECYSTECTOMY WITH INTRAOPERATIVE CHOLANGIOGRAM (N/A Abdomen)     Patient location during evaluation: PACU Anesthesia Type: General Level of consciousness: awake Pain management: pain level controlled Vital Signs Assessment: post-procedure vital signs reviewed and stable Respiratory status: spontaneous breathing Cardiovascular status: stable Postop Assessment: no apparent nausea or vomiting Anesthetic complications: no    Last Vitals:  Vitals:   04/12/18 1430 04/12/18 1445  BP: 139/72 133/78  Pulse: 76 87  Resp: (!) 21 (!) 28  Temp:  36.7 C  SpO2: 100% 100%    Last Pain:  Vitals:   04/12/18 1500  TempSrc:   PainSc: 8                  Dalvin Clipper

## 2018-04-13 ENCOUNTER — Encounter (HOSPITAL_COMMUNITY): Payer: Self-pay | Admitting: General Surgery

## 2018-04-13 MED ORDER — ACETAMINOPHEN 500 MG PO TABS
1000.0000 mg | ORAL_TABLET | Freq: Three times a day (TID) | ORAL | Status: DC
  Administered 2018-04-13: 1000 mg via ORAL
  Filled 2018-04-13: qty 2

## 2018-04-13 MED ORDER — IBUPROFEN 200 MG PO TABS: ORAL_TABLET | ORAL | Status: DC

## 2018-04-13 MED ORDER — ONDANSETRON HCL 4 MG PO TABS: 4.0000 mg | ORAL_TABLET | Freq: Three times a day (TID) | ORAL | 0 refills | Status: DC | PRN

## 2018-04-13 MED ORDER — ACETAMINOPHEN 500 MG PO TABS: ORAL_TABLET | ORAL | 0 refills | Status: DC

## 2018-04-13 MED ORDER — OXYCODONE HCL 5 MG PO TABS
5.0000 mg | ORAL_TABLET | ORAL | Status: DC | PRN
  Administered 2018-04-13: 5 mg via ORAL
  Filled 2018-04-13: qty 1

## 2018-04-13 MED ORDER — IBUPROFEN 200 MG PO TABS: 600.0000 mg | ORAL_TABLET | Freq: Three times a day (TID) | ORAL | Status: DC | PRN

## 2018-04-13 MED ORDER — OXYCODONE HCL 5 MG PO TABS: 5.0000 mg | ORAL_TABLET | Freq: Four times a day (QID) | ORAL | 0 refills | Status: DC | PRN

## 2018-04-13 MED ORDER — MORPHINE SULFATE (PF) 2 MG/ML IV SOLN: 1.0000 mg | INTRAVENOUS | Status: DC | PRN

## 2018-04-13 NOTE — Discharge Summary (Signed)
Physician Discharge Summary  Patient ID: Susan Brennan MRN: [id number] DOB/AGE: 1995-06-01 22 y.o.  Admit date: 04/11/2018 Discharge date: 04/13/2018  Admission Diagnoses:  Acute cholecystitis Morbid obesity BMI 58.1 Hx of anxiety/depression  Discharge Diagnoses:  Acute cholecystitis Morbid obesity BMI 58.1 Hx of anxiety/depression    Active Problems:   Cholecystitis with cholelithiasis   PROCEDURES: Laparoscopic cholecystectomy, 04/12/2018, Dr. Franky Macho [facility] Course:  Susan Brennan is a 23 y.o. (DOB: 08-Dec-1995)  white female whose primary care physician is [name], [name], [employer] .             Her brother, Susan Brennan, is in the room with her.              The patient has had some vague reflux, nausea, epigastric pain going on for about 1 week.  Over the counter medication has not helped this.  She said about 3 years ago she was evaluated for possible gallbladder disease by gastroenterologist, but her evaluation at that time was negative.  She does not remember his name. (Dr. Tiburcio Pea was the ordering physician in 2016 for an [place]) She has had no prior stomach, liver, pancreas, or colon disease.  She has had no prior abdominal surgery.             She has not spoken to her PCP about her symptoms.  She had a CT scan of the abdomen on 08/05/2017 which was negative. She had an abdominal US on 04/11/2018 which showed moderate cholelithiasis with borderline wall thickening and suggestion of positive sonographic Murphy sign. She was admitted to the hospital by Dr. Ezzard Standing and taken to the OR the following AM.  She underwent surgery as noted above and returned to the floor.  Her diet was advanced, she was mobilized, and discharged on the first post op day.   CBC Latest Ref Rng & Units 04/11/2018 08/05/2017  WBC 4.0 - 10.5 K/uL 6.5 8.4  Hemoglobin 12.0 - 15.0 g/dL 84.5 36.4  Hematocrit 68.0 - 46.0 % 42.1 41.3  Platelets 150 - 400 K/uL 286 299   CMP Latest Ref Rng & Units  04/11/2018 08/05/2017  Glucose 70 - 99 mg/dL 97 98  BUN 6 - 20 mg/dL 12 8  Creatinine 3.21 - 1.00 mg/dL 2.24 8.25  Sodium 003 - 145 mmol/L 137 138  Potassium 3.5 - 5.1 mmol/L 3.7 3.7  Chloride 98 - 111 mmol/L 104 102  CO2 22 - 32 mmol/L 27 27  Calcium 8.9 - 10.3 mg/dL 9.4 8.9  Total Protein 6.5 - 8.1 g/dL 7.2 7.7  Total Bilirubin 0.3 - 1.2 mg/dL 7.0(W) 0.3  Alkaline Phos 38 - 126 U/L 88 98  AST 15 - 41 U/L 23 24  ALT 0 - 44 U/L 23 21               Abd ultrasound 04/11/18:  Moderate cholelithiasis with borderline wall thickening and suggestion of positive sonographic Murphy sign. Findings may be due to acute cholecystitis.   Condition on DC:  Improved    Disposition: Discharge disposition: 01-Home or Self Care        Allergies as of 04/13/2018      Reactions   Contrast Media [iodinated Diagnostic Agents] Itching, Rash      Medication List    STOP taking these medications   ADVIL PM PO     TAKE these medications   acetaminophen 500 MG tablet Commonly known as:  TYLENOL Take 1000 mg every 8  hours as needed for pain.  This is your primary pain medication.  Alternate this with ibuprofen.  Do not exceed 4000 mg of Tylenol per day can harm your liver.  You can buy this over-the-counter at any drugstore.   bismuth subsalicylate 262 MG chewable tablet Commonly known as:  PEPTO BISMOL Chew 524 mg by mouth as needed.   cholecalciferol 10 MCG (400 UNIT) Tabs tablet Commonly known as:  VITAMIN D3 Take 400 Units by mouth.   dicyclomine 20 MG tablet Commonly known as:  BENTYL Take 1 tablet (20 mg total) by mouth 2 (two) times daily.   DULoxetine 30 MG capsule Commonly known as:  CYMBALTA Take 30 mg by mouth daily.   ibuprofen 200 MG tablet Commonly known as:  ADVIL,MOTRIN You can take 2 to 3 tablets every 6 hours as needed for pain.  You can alternate this with plain Tylenol.  Do not exceed this limit.  You can buy this over-the-counter at any drugstore.  If the Tylenol  and ibuprofen are ineffective you can use the oxycodone as your third choice for pain medications. What changed:    how much to take  how to take this  when to take this  reasons to take this  additional instructions   LORazepam 1 MG tablet Commonly known as:  ATIVAN Take 1 mg by mouth 2 (two) times daily.   metFORMIN 750 MG 24 hr tablet Commonly known as:  GLUCOPHAGE-XR   ondansetron 4 MG tablet Commonly known as:  Zofran Take 1 tablet (4 mg total) by mouth every 8 (eight) hours as needed for nausea or vomiting.   oxyCODONE 5 MG immediate release tablet Commonly known as:  Oxy IR/ROXICODONE Take 1 tablet (5 mg total) by mouth every 6 (six) hours as needed for moderate pain, severe pain or breakthrough pain.   phenylephrine 10 MG Tabs tablet Commonly known as:  SUDAFED PE Take 10 mg by mouth every 4 (four) hours as needed.   zolpidem 10 MG tablet Commonly known as:  AMBIEN Take 10 mg by mouth at bedtime as needed for sleep.      Follow-up Information    [facility] Follow up on 04/27/2018.   Specialty:  General Surgery Why:  Your follow up will be a phone call appointment  (due to the [facility] virus, to limit exposure.) Hedda Slade will call you at 10:00AM.  Please email a picture if you have an concerns with you wound to : [email or web address]  Contact information: [address] [place] [address] [phone]        [name], [name], PA-C Follow up.   Specialty:  Physician Assistant Why:  call and let them know you had surgery Contact information: [address] [place] [address] [phone]           Signed: Sherrie George 04/13/2018, 1:14 PM

## 2018-04-13 NOTE — Progress Notes (Signed)
1 Day Post-Op    CC: Abdominal pain  Subjective: Patient sore this a.m. has not eaten much but is overall doing well.  He has been up walking.  We discussed pain control with Tylenol and ibuprofen as her primary drugs and then oxycodone as a third pain medication.  Objective: Vital signs in last 24 hours: Temp:  [97.7 F (36.5 C)-98.7 F (37.1 C)] 98.3 F (36.8 C) (03/24 0622) Pulse Rate:  [71-99] 74 (03/24 0622) Resp:  [16-28] 17 (03/24 0622) BP: (110-173)/(70-134) 145/77 (03/24 0622) SpO2:  [94 %-100 %] 95 % (03/24 0622) Last BM Date: 04/12/18  Intake/Output from previous day: 03/23 0701 - 03/24 0700 In: 2828.9 [P.O.:540; I.V.:2120.9; IV Piggyback:168.1] Out: 3075 [Urine:3050; Blood:25] Intake/Output this shift: No intake/output data recorded.  General appearance: alert, cooperative and no distress Resp: clear to auscultation bilaterally GI: Soft, sore, port sites all look fine.  Lab Results:  Recent Labs    04/11/18 1115  WBC 6.5  HGB 13.5  HCT 42.1  PLT 286    BMET Recent Labs    04/11/18 1115  NA 137  K 3.7  CL 104  CO2 27  GLUCOSE 97  BUN 12  CREATININE 0.69  CALCIUM 9.4   PT/INR No results for input(s): LABPROT, INR in the last 72 hours.  Recent Labs  Lab 04/11/18 1115  AST 23  ALT 23  ALKPHOS 88  BILITOT 0.2*  PROT 7.2  ALBUMIN 3.8     Lipase     Component Value Date/Time   LIPASE 36 04/11/2018 1115     Medications: . DULoxetine  30 mg Oral Daily  . metFORMIN  750 mg Oral Q breakfast     Assessment/Plan DX anxiety and depression BMI 58.1/morbid obesity  Acute cholecystitis Laparoscopic cholecystectomy, 04/12/2018, Dr. Franky Macho Kinsinger  FEN: Carb modified diet ID: Rocephin 3/22-3/23/2020 DVT: Lovenox 3/22    Plan: Going to mobilize, advance her diet this morning.  Work on pain control with p.o. pain medications and discharge later this a.m.  LOS: 0 days    Susan Brennan 04/13/2018 [phone]

## 2018-09-20 ENCOUNTER — Encounter: Payer: Self-pay | Admitting: Gastroenterology

## 2018-10-20 ENCOUNTER — Ambulatory Visit: Payer: Managed Care, Other (non HMO) | Admitting: Gastroenterology

## 2018-10-20 ENCOUNTER — Encounter: Payer: Self-pay | Admitting: Gastroenterology

## 2018-10-20 ENCOUNTER — Encounter (INDEPENDENT_AMBULATORY_CARE_PROVIDER_SITE_OTHER): Payer: Self-pay

## 2018-10-20 VITALS — BP 108/72 | HR 97 | Temp 97.4°F | Ht 68.0 in | Wt 384.0 lb

## 2018-10-20 DIAGNOSIS — R197 Diarrhea, unspecified: Secondary | ICD-10-CM

## 2018-10-20 MED ORDER — CHOLESTYRAMINE 4 G PO PACK: 4.0000 g | PACK | Freq: Every day | ORAL | 11 refills | Status: DC

## 2018-10-20 NOTE — Progress Notes (Signed)
HPI: This is a very pleasant 23 year old woman who was referred to me by [name], [name], PA-C  to evaluate loose stools, abdominal pain, nausea.    Chief complaint is loose stools, abdominal pain, nausea  She has had intermittent crampy abdominal pains and nausea on and off for many years.  They seem to be quite a bit worse lately.  The abdominal pains are epigastric.  She has chronic pyrosis for which he takes proton pump inhibitor Protonix which helps the majority of the time.  She had her gallbladder removed laparoscopically 6 months ago and since then her upper GI symptoms have definitely worsened and she has also been bothered by loose stools.  She will have watery nonbloody loose stools for 5 times a day.  Never nocturnal.  Her weight is overall stable.  She is morbidly obese.  She takes ibuprofen about once a week.  Previous patient at [facility] gastroenterology many years ago.  She has never had an upper endoscopy or colonoscopy.    Old Data Reviewed:  She underwent laparoscopic cholecystectomy March 2020 for cholecystitis.  Surgical path showed chronic cholecystitis with cholelithiasis.  Blood work March 2020 show normal CBC and normal complete metabolic profile    Review of systems: Pertinent positive and negative review of systems were noted in the above HPI section. All other review negative.   Past Medical History:  Diagnosis Date  . Anxiety   . Depression   . HA (headache)     Past Surgical History:  Procedure Laterality Date  . CHOLECYSTECTOMY N/A 04/12/2018   Procedure: LAPAROSCOPIC CHOLECYSTECTOMY WITH INTRAOPERATIVE CHOLANGIOGRAM;  Surgeon: Sheliah Hatch De Blanch, MD;  Location: [facility];  Service: General;  Laterality: N/A;    Current Outpatient Medications  Medication Sig Dispense Refill  . acetaminophen (TYLENOL) 500 MG tablet Take 1000 mg every 8 hours as needed for pain.  This is your primary pain medication.  Alternate this with ibuprofen.  Do not  exceed 4000 mg of Tylenol per day can harm your liver.  You can buy this over-the-counter at any drugstore. 30 tablet 0  . bismuth subsalicylate (PEPTO BISMOL) 262 MG chewable tablet Chew 524 mg by mouth as needed.    . cholecalciferol (VITAMIN D) 400 units TABS tablet Take 400 Units by mouth.    . DULoxetine (CYMBALTA) 30 MG capsule Take 30 mg by mouth daily.    Marland Kitchen ibuprofen (ADVIL,MOTRIN) 200 MG tablet You can take 2 to 3 tablets every 6 hours as needed for pain.  You can alternate this with plain Tylenol.  Do not exceed this limit.  You can buy this over-the-counter at any drugstore.  If the Tylenol and ibuprofen are ineffective you can use the oxycodone as your third choice for pain medications.    Marland Kitchen LORazepam (ATIVAN) 1 MG tablet Take 1 mg by mouth 2 (two) times daily.    . metFORMIN (GLUCOPHAGE-XR) 750 MG 24 hr tablet     . oxyCODONE (OXY IR/ROXICODONE) 5 MG immediate release tablet Take 1 tablet (5 mg total) by mouth every 6 (six) hours as needed for moderate pain, severe pain or breakthrough pain. 15 tablet 0  . pantoprazole (PROTONIX) 40 MG tablet Take 40 mg by mouth daily.    . phenylephrine (SUDAFED PE) 10 MG TABS tablet Take 10 mg by mouth every 4 (four) hours as needed.    . zolpidem (AMBIEN) 10 MG tablet Take 10 mg by mouth at bedtime as needed for sleep.  No current facility-administered medications for this visit.     Allergies as of 10/20/2018 - Review Complete 10/20/2018  Allergen Reaction Noted  . Toradol [ketorolac tromethamine]  10/20/2018  . Contrast media [iodinated diagnostic agents] Itching and Rash 04/11/2018    Family History  Problem Relation Age of Onset  . Irritable bowel syndrome Mother   . Diabetes Father   . Heart disease Father   . Atrial fibrillation Father   . Colon cancer Neg Hx   . Stomach cancer Neg Hx   . Esophageal cancer Neg Hx   . Pancreatic cancer Neg Hx     Social History   Socioeconomic History  . Marital status: Single    Spouse  name: Not on file  . Number of children: Not on file  . Years of education: Not on file  . Highest education level: Not on file  Occupational History  . Not on file  Social Needs  . Financial resource strain: Not on file  . Food insecurity    Worry: Not on file    Inability: Not on file  . Transportation needs    Medical: Not on file    Non-medical: Not on file  Tobacco Use  . Smoking status: Never Smoker  . Smokeless tobacco: Never Used  Substance and Sexual Activity  . Alcohol use: Yes    Comment: Occ   . Drug use: No  . Sexual activity: Yes    Partners: Male    Birth control/protection: None  Lifestyle  . Physical activity    Days per week: Not on file    Minutes per session: Not on file  . Stress: Not on file  Relationships  . Social Musicianconnections    Talks on phone: Not on file    Gets together: Not on file    Attends religious service: Not on file    Active member of club or organization: Not on file    Attends meetings of clubs or organizations: Not on file    Relationship status: Not on file  . Intimate partner violence    Fear of current or ex partner: Not on file    Emotionally abused: Not on file    Physically abused: Not on file    Forced sexual activity: Not on file  Other Topics Concern  . Not on file  Social History Narrative  . Not on file     Physical Exam: BP 108/72   Pulse 97   Temp (!) 97.4 F (36.3 C)   Ht 5\' 8"  (1.727 m)   Wt (!) 384 lb (174.2 kg)   BMI 58.39 kg/m  Constitutional: Morbidly obese Psychiatric: alert and oriented x3 Eyes: extraocular movements intact Mouth: oral pharynx moist, no lesions Neck: supple no lymphadenopathy Cardiovascular: heart regular rate and rhythm Lungs: clear to auscultation bilaterally Abdomen: soft, nontender, nondistended, no obvious ascites, no peritoneal signs, normal bowel sounds Extremities: no lower extremity edema bilaterally Skin: no lesions on visible extremities   Assessment and  plan: 23 y.o. female with chronic upper GI symptoms that have worsened since laparoscopic cholecystectomy 6 months ago, also started having diarrhea since then.  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  I am going to try her empirically on cholestyramine 4 g powder once daily and she will return to see me in 6 to 8 weeks.  Depending on how she responds she may need further  testing including colonoscopy and/or upper endoscopy.  I see no reason for any further blood tests or imaging studies at this point.   Please see the "Patient Instructions" section for addition details about the plan.   Owens Loffler, MD [facility] 10/20/2018, 1:40 PM  Cc: [name], Earlie Server, PA-C

## 2018-10-20 NOTE — Patient Instructions (Addendum)
We have sent the following medications to your pharmacy for you to pick up at your convenience: Cholestyramine  Next office visit 12/07/18 at 320pm  Thank you for entrusting me with your care and choosing [facility].  Dr Ardis Hughs

## 2018-12-07 ENCOUNTER — Other Ambulatory Visit: Payer: Self-pay

## 2018-12-07 ENCOUNTER — Ambulatory Visit: Payer: Managed Care, Other (non HMO) | Admitting: Gastroenterology

## 2018-12-07 ENCOUNTER — Encounter: Payer: Self-pay | Admitting: Gastroenterology

## 2018-12-07 VITALS — BP 116/66 | HR 87 | Temp 97.2°F | Ht 68.0 in | Wt 385.6 lb

## 2018-12-07 DIAGNOSIS — R1310 Dysphagia, unspecified: Secondary | ICD-10-CM | POA: Diagnosis not present

## 2018-12-07 NOTE — Progress Notes (Signed)
HPI: This is a very pleasant 23 year old woman whom I last saw about 6 weeks ago  I met her about 6 weeks ago and we discussed her chronic upper and lower GI symptoms that seem to have worsened after laparoscopic cholecystectomy this past spring.  Blood work at the time including CBC and complete metabolic profile March 4259 were normal.  I thought she was probably having bile acid related diarrhea which might also be causing some of her upper GI symptoms.  I empirically put her on cholestyramine 4 g powder once daily and we plan for this follow-up visit.    Her bowel issues have completely resolved since starting the cholestyramine.  Her diarrhea has completely resolved.  She is quite happy about the results.  She is still however bothered by a variety of upper abdominal pains.  She has a right sided intermittent stabbing pain.  She has a left upper quadrant ringing type pain and she has an epigastric elephant pressure-like pain.  She is also bothered by intermittent solid food dysphagia.    Her weight has been overall stable   ROS: complete GI ROS as described in HPI, all other review negative.  Constitutional:  No unintentional weight loss   Past Medical History:  Diagnosis Date  . Anxiety   . Depression   . HA (headache)   . Prediabetes     Past Surgical History:  Procedure Laterality Date  . CHOLECYSTECTOMY N/A 04/12/2018   Procedure: LAPAROSCOPIC CHOLECYSTECTOMY WITH INTRAOPERATIVE CHOLANGIOGRAM;  Surgeon: Kieth Brightly Arta Bruce, MD;  Location: [facility];  Service: General;  Laterality: N/A;    Current Outpatient Medications  Medication Sig Dispense Refill  . acetaminophen (TYLENOL) 500 MG tablet Take 1000 mg every 8 hours as needed for pain.  This is your primary pain medication.  Alternate this with ibuprofen.  Do not exceed 4000 mg of Tylenol per day can harm your liver.  You can buy this over-the-counter at any drugstore. 30 tablet 0  . bismuth subsalicylate (PEPTO BISMOL) 262  MG chewable tablet Chew 524 mg by mouth as needed.    . cholecalciferol (VITAMIN D) 400 units TABS tablet Take 400 Units by mouth.    . cholestyramine (QUESTRAN) 4 g packet Take 1 packet (4 g total) by mouth daily. 30 each 11  . ibuprofen (ADVIL,MOTRIN) 200 MG tablet You can take 2 to 3 tablets every 6 hours as needed for pain.  You can alternate this with plain Tylenol.  Do not exceed this limit.  You can buy this over-the-counter at any drugstore.  If the Tylenol and ibuprofen are ineffective you can use the oxycodone as your third choice for pain medications.    . pantoprazole (PROTONIX) 40 MG tablet Take 40 mg by mouth daily.    . phenylephrine (SUDAFED PE) 10 MG TABS tablet Take 10 mg by mouth every 4 (four) hours as needed.     No current facility-administered medications for this visit.     Allergies as of 12/07/2018 - Review Complete 12/07/2018  Allergen Reaction Noted  . Toradol [ketorolac tromethamine]  10/20/2018  . Contrast media [iodinated diagnostic agents] Itching and Rash 04/11/2018    Family History  Problem Relation Age of Onset  . Irritable bowel syndrome Mother   . Diabetes Father   . Heart disease Father   . Atrial fibrillation Father   . Colon cancer Neg Hx   . Stomach cancer Neg Hx   . Esophageal cancer Neg Hx   . Pancreatic cancer  Neg Hx     Social History   Socioeconomic History  . Marital status: Single    Spouse name: Not on file  . Number of children: Not on file  . Years of education: Not on file  . Highest education level: Not on file  Occupational History  . Not on file  Social Needs  . Financial resource strain: Not on file  . Food insecurity    Worry: Not on file    Inability: Not on file  . Transportation needs    Medical: Not on file    Non-medical: Not on file  Tobacco Use  . Smoking status: Never Smoker  . Smokeless tobacco: Never Used  Substance and Sexual Activity  . Alcohol use: Yes    Comment: Occ   . Drug use: No  . Sexual  activity: Yes    Partners: Male    Birth control/protection: None  Lifestyle  . Physical activity    Days per week: Not on file    Minutes per session: Not on file  . Stress: Not on file  Relationships  . Social Herbalist on phone: Not on file    Gets together: Not on file    Attends religious service: Not on file    Active member of club or organization: Not on file    Attends meetings of clubs or organizations: Not on file    Relationship status: Not on file  . Intimate partner violence    Fear of current or ex partner: Not on file    Emotionally abused: Not on file    Physically abused: Not on file    Forced sexual activity: Not on file  Other Topics Concern  . Not on file  Social History Narrative  . 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  She knows she can continue that and and try to taper it periodically to see if her bile acid issues have improved with time.  She has several different upper abdominal discomforts and she also has intermittent solid food dysphagia.  I think a lot of these might be functional, certainly her BMI of 59 may contribute.  I recommended upper endoscopy at her soonest convenience especially given her dysphagia.  Please see the "Patient Instructions" section for addition details about the plan.  Owens Loffler, MD [facility] 12/07/2018, 3:33 PM

## 2018-12-07 NOTE — Patient Instructions (Addendum)
Due to recent COVID-19 restrictions implemented by our local and state authorities and in an effort to keep both patients and staff as safe as possible, our hospital system now requires COVID-19 testing prior to any scheduled hospital procedure. Please go to our [facility] location drive thru testing site ([address] [place], [place] [address]) on 01/10/19 at  9:30am. There will be multiple testing areas, the first checkpoint being for pre-procedure/surgery testing. Get into the right (yellow) lane that leads to the PAT testing team. You will not be billed at the time of testing but may receive a bill later depending on your insurance. The approximate cost of the test is $100. You must agree to quarantine from the time of your testing until the procedure date on 01/13/19 . This should include staying at home with ONLY the people you live with. Avoid take-out, grocery store shopping or leaving the house for any non-emergent reason. Failure to have your COVID-19 test done on the date and time you have been scheduled will result in cancellation of procedure. Please call our office at [phone] if you have any questions.     You have been scheduled for an endoscopy. Please follow written instructions given to you at your visit today. If you use inhalers (even only as needed), please bring them with you on the day of your procedure.

## 2019-01-10 ENCOUNTER — Other Ambulatory Visit (HOSPITAL_COMMUNITY)
Admission: RE | Admit: 2019-01-10 | Discharge: 2019-01-10 | Disposition: A | Discharge status: Home / Self Care | Payer: Managed Care, Other (non HMO) | Source: Ambulatory Visit | Attending: Gastroenterology | Admitting: Gastroenterology

## 2019-01-10 DIAGNOSIS — U071 COVID-19: Secondary | ICD-10-CM | POA: Insufficient documentation

## 2019-01-10 DIAGNOSIS — Z01812 Encounter for preprocedural laboratory examination: Secondary | ICD-10-CM | POA: Diagnosis present

## 2019-01-11 ENCOUNTER — Encounter (HOSPITAL_COMMUNITY): Payer: Self-pay | Admitting: Certified Registered"

## 2019-01-12 ENCOUNTER — Telehealth: Payer: Self-pay | Admitting: Gastroenterology

## 2019-01-12 LAB — NOVEL CORONAVIRUS, NAA (HOSP ORDER, SEND-OUT TO REF LAB; TAT 18-24 HRS): SARS-CoV-2, NAA: DETECTED — AB

## 2019-01-12 NOTE — Telephone Encounter (Signed)
Spoke with Susan Brennan and Dr. Ardis Hughs had already called and let her know. Let Susan Brennan know we will reschedule her procedure once she has recovered. Appt cancelled.

## 2019-01-13 ENCOUNTER — Ambulatory Visit (HOSPITAL_COMMUNITY)
Admission: RE | Admit: 2019-01-13 | Payer: Managed Care, Other (non HMO) | Source: Home / Self Care | Admitting: Gastroenterology

## 2019-01-13 SURGERY — ESOPHAGOGASTRODUODENOSCOPY (EGD) WITH PROPOFOL
Anesthesia: Monitor Anesthesia Care

## 2019-02-17 ENCOUNTER — Telehealth: Payer: Self-pay

## 2019-02-17 NOTE — Telephone Encounter (Signed)
The pt notified and will call back to make appt at her convenience.

## 2019-02-17 NOTE — Telephone Encounter (Signed)
-----   Message from Loretha Stapler, RN sent at 01/17/2019  1:00 PM EST ----- When you are back, can you schedule her for repeat procedure in about 1 month from now on a Thursday at [facility]. Thank you

## 2019-07-05 ENCOUNTER — Other Ambulatory Visit: Payer: Self-pay | Admitting: Physician Assistant

## 2019-07-05 DIAGNOSIS — S8991XA Unspecified injury of right lower leg, initial encounter: Secondary | ICD-10-CM

## 2019-11-14 ENCOUNTER — Other Ambulatory Visit: Payer: Self-pay | Admitting: Orthopedic Surgery

## 2019-11-23 ENCOUNTER — Encounter (HOSPITAL_BASED_OUTPATIENT_CLINIC_OR_DEPARTMENT_OTHER): Payer: Self-pay | Admitting: Orthopedic Surgery

## 2019-11-23 ENCOUNTER — Other Ambulatory Visit: Payer: Self-pay

## 2019-11-23 NOTE — Progress Notes (Signed)
chart reviewed by Dr. Michelle Piper and will proceed with surgery as scheduled at [facility]

## 2019-11-26 ENCOUNTER — Other Ambulatory Visit (HOSPITAL_COMMUNITY): Admission: RE | Admit: 2019-11-26 | Payer: Managed Care, Other (non HMO) | Source: Ambulatory Visit

## 2019-11-28 ENCOUNTER — Encounter (HOSPITAL_COMMUNITY): Payer: Self-pay | Admitting: Orthopedic Surgery

## 2019-11-28 ENCOUNTER — Other Ambulatory Visit (HOSPITAL_COMMUNITY)
Admission: RE | Admit: 2019-11-28 | Discharge: 2019-11-28 | Disposition: A | Discharge status: Home / Self Care | Payer: Managed Care, Other (non HMO) | Source: Ambulatory Visit | Attending: Orthopedic Surgery | Admitting: Orthopedic Surgery

## 2019-11-28 DIAGNOSIS — Z20822 Contact with and (suspected) exposure to covid-19: Secondary | ICD-10-CM | POA: Insufficient documentation

## 2019-11-28 DIAGNOSIS — Z01812 Encounter for preprocedural laboratory examination: Secondary | ICD-10-CM | POA: Insufficient documentation

## 2019-11-28 LAB — SARS CORONAVIRUS 2 (TAT 6-24 HRS): SARS Coronavirus 2: NEGATIVE

## 2019-11-28 NOTE — Progress Notes (Addendum)
COVID Vaccine Completed: No Date COVID Vaccine completed: N/A COVID vaccine manufacturer: N/A  PCP - Dorothy [name] P.A. Cardiologist - N/A  Chest x-ray - greater than 1 year in epic EKG - greater than 1 year in epic Stress Test - N/A ECHO - N/A Cardiac Cath - N/A Pacemaker/ICD device last checked:N/A  Sleep Study - N/A CPAP - N/A  Fasting Blood Sugar - N/A Checks Blood Sugar _N/A____ times a day  Blood Thinner Instructions: N/A Aspirin Instructions: N/A Last Dose:N/A  Anesthesia review: N/A  Patient denies shortness of breath, fever, cough and chest pain at PAT appointment   Patient verbalized understanding of instructions that were given to them at the PAT appointment. Patient was also instructed that they will need to review over the PAT instructions again at home before surgery.

## 2019-11-28 NOTE — Progress Notes (Signed)
BMI-53, Dr. Hyacinth Meeker aware, moving case to main OR. Spoke with Darel Hong at Dr. Luiz Blare office.

## 2019-11-29 ENCOUNTER — Other Ambulatory Visit: Payer: Self-pay

## 2019-11-29 ENCOUNTER — Encounter (HOSPITAL_COMMUNITY): Payer: Self-pay | Admitting: Orthopedic Surgery

## 2019-11-29 MED ORDER — DEXTROSE 5 % IV SOLN
3.0000 g | INTRAVENOUS | Status: DC
  Filled 2019-11-29: qty 3000

## 2019-11-30 ENCOUNTER — Ambulatory Visit (HOSPITAL_COMMUNITY)
Admission: RE | Admit: 2019-11-30 | Discharge: 2019-11-30 | Disposition: A | Discharge status: Home / Self Care | Payer: Managed Care, Other (non HMO) | Attending: Orthopedic Surgery | Admitting: Orthopedic Surgery

## 2019-11-30 ENCOUNTER — Encounter (HOSPITAL_COMMUNITY): Payer: Self-pay | Admitting: Orthopedic Surgery

## 2019-11-30 ENCOUNTER — Encounter (HOSPITAL_COMMUNITY)
Admission: RE | Disposition: A | Discharge status: Home / Self Care | Payer: Self-pay | Source: Home / Self Care | Attending: Orthopedic Surgery

## 2019-11-30 ENCOUNTER — Ambulatory Visit (HOSPITAL_COMMUNITY): Payer: Managed Care, Other (non HMO) | Admitting: Certified Registered Nurse Anesthetist

## 2019-11-30 ENCOUNTER — Other Ambulatory Visit: Payer: Self-pay

## 2019-11-30 ENCOUNTER — Ambulatory Visit (HOSPITAL_COMMUNITY): Payer: Managed Care, Other (non HMO)

## 2019-11-30 DIAGNOSIS — X501XXA Overexertion from prolonged static or awkward postures, initial encounter: Secondary | ICD-10-CM | POA: Insufficient documentation

## 2019-11-30 DIAGNOSIS — F17290 Nicotine dependence, other tobacco product, uncomplicated: Secondary | ICD-10-CM | POA: Insufficient documentation

## 2019-11-30 DIAGNOSIS — Z9889 Other specified postprocedural states: Secondary | ICD-10-CM

## 2019-11-30 DIAGNOSIS — S83511A Sprain of anterior cruciate ligament of right knee, initial encounter: Secondary | ICD-10-CM | POA: Diagnosis not present

## 2019-11-30 DIAGNOSIS — S83281A Other tear of lateral meniscus, current injury, right knee, initial encounter: Secondary | ICD-10-CM | POA: Diagnosis not present

## 2019-11-30 HISTORY — DX: Gastro-esophageal reflux disease without esophagitis: K21.9

## 2019-11-30 HISTORY — DX: Morbid (severe) obesity due to excess calories: E66.01

## 2019-11-30 HISTORY — PX: ANTERIOR CRUCIATE LIGAMENT REPAIR: SHX115

## 2019-11-30 LAB — BASIC METABOLIC PANEL
Anion gap: 10 (ref 5–15)
BUN: 9 mg/dL (ref 6–20)
CO2: 23 mmol/L (ref 22–32)
Calcium: 8.7 mg/dL — ABNORMAL LOW (ref 8.9–10.3)
Chloride: 106 mmol/L (ref 98–111)
Creatinine, Ser: 0.73 mg/dL (ref 0.44–1.00)
GFR, Estimated: >60 mL/min (ref >60)
Glucose, Bld: 101 mg/dL — ABNORMAL HIGH (ref 70–99)
Potassium: 4 mmol/L (ref 3.5–5.1)
Sodium: 139 mmol/L (ref 135–145)

## 2019-11-30 LAB — PREGNANCY, URINE: Preg Test, Ur: NEGATIVE

## 2019-11-30 LAB — CBC
HCT: 41.8 % (ref 36.0–46.0)
Hemoglobin: 14.5 g/dL (ref 12.0–15.0)
MCH: 32.4 pg (ref 26.0–34.0)
MCHC: 34.7 g/dL (ref 30.0–36.0)
MCV: 93.5 fL (ref 80.0–100.0)
Platelets: 285 10*3/uL (ref 150–400)
RBC: 4.47 MIL/uL (ref 3.87–5.11)
RDW: 12 % (ref 11.5–15.5)
WBC: 6.1 10*3/uL (ref 4.0–10.5)
nRBC: 0 % (ref 0.0–0.2)

## 2019-11-30 LAB — GLUCOSE, CAPILLARY: Glucose-Capillary: 101 mg/dL — ABNORMAL HIGH (ref 70–99)

## 2019-11-30 LAB — HEMOGLOBIN A1C
Hgb A1c MFr Bld: 4.8 % (ref 4.8–5.6)
Mean Plasma Glucose: 91.06 mg/dL

## 2019-11-30 SURGERY — RECONSTRUCTION, KNEE, ACL, USING HAMSTRING GRAFT
Anesthesia: Spinal | Laterality: Right

## 2019-11-30 MED ORDER — KETOROLAC TROMETHAMINE 30 MG/ML IJ SOLN: 30.0000 mg | Freq: Once | INTRAMUSCULAR | Status: AC | PRN

## 2019-11-30 MED ORDER — FENTANYL CITRATE (PF) 100 MCG/2ML IJ SOLN
INTRAMUSCULAR | Status: AC
  Filled 2019-11-30: qty 2

## 2019-11-30 MED ORDER — EPINEPHRINE PF 1 MG/ML IJ SOLN
INTRAMUSCULAR | Status: DC | PRN
  Administered 2019-11-30: 1 mg

## 2019-11-30 MED ORDER — ROPIVACAINE HCL 5 MG/ML IJ SOLN
INTRAMUSCULAR | Status: DC | PRN
  Administered 2019-11-30 (×2): 5 mL via PERINEURAL

## 2019-11-30 MED ORDER — ONDANSETRON HCL 4 MG/2ML IJ SOLN
INTRAMUSCULAR | Status: AC
  Filled 2019-11-30: qty 2

## 2019-11-30 MED ORDER — MIDAZOLAM HCL 2 MG/2ML IJ SOLN
1.0000 mg | INTRAMUSCULAR | Status: DC
  Administered 2019-11-30 (×2): 1 mg via INTRAVENOUS
  Filled 2019-11-30: qty 2

## 2019-11-30 MED ORDER — DEXAMETHASONE SODIUM PHOSPHATE 10 MG/ML IJ SOLN
INTRAMUSCULAR | Status: AC
  Filled 2019-11-30: qty 1

## 2019-11-30 MED ORDER — LACTATED RINGERS IV SOLN: INTRAVENOUS | Status: DC

## 2019-11-30 MED ORDER — CHLORHEXIDINE GLUCONATE 0.12 % MT SOLN
15.0000 mL | Freq: Once | OROMUCOSAL | Status: AC
  Administered 2019-11-30: 15 mL via OROMUCOSAL

## 2019-11-30 MED ORDER — MEPERIDINE HCL 50 MG/ML IJ SOLN: 6.2500 mg | INTRAMUSCULAR | Status: DC | PRN

## 2019-11-30 MED ORDER — PROMETHAZINE HCL 25 MG/ML IJ SOLN: 6.2500 mg | INTRAMUSCULAR | Status: DC | PRN

## 2019-11-30 MED ORDER — BUPIVACAINE-EPINEPHRINE 0.5% -1:200000 IJ SOLN
INTRAMUSCULAR | Status: AC
  Filled 2019-11-30: qty 1

## 2019-11-30 MED ORDER — LIDOCAINE 2% (20 MG/ML) 5 ML SYRINGE
INTRAMUSCULAR | Status: DC | PRN
  Administered 2019-11-30: 60 mg via INTRAVENOUS

## 2019-11-30 MED ORDER — KETOROLAC TROMETHAMINE 30 MG/ML IJ SOLN
INTRAMUSCULAR | Status: AC
  Administered 2019-11-30: 30 mg via INTRAVENOUS
  Filled 2019-11-30: qty 1

## 2019-11-30 MED ORDER — OXYCODONE HCL 5 MG PO TABS
ORAL_TABLET | ORAL | Status: AC
  Filled 2019-11-30: qty 2

## 2019-11-30 MED ORDER — ONDANSETRON HCL 4 MG/2ML IJ SOLN
INTRAMUSCULAR | Status: DC | PRN
  Administered 2019-11-30: 4 mg via INTRAVENOUS

## 2019-11-30 MED ORDER — FENTANYL CITRATE (PF) 100 MCG/2ML IJ SOLN
INTRAMUSCULAR | Status: AC
  Administered 2019-11-30: 50 ug via INTRAVENOUS
  Filled 2019-11-30: qty 2

## 2019-11-30 MED ORDER — FENTANYL CITRATE (PF) 100 MCG/2ML IJ SOLN
50.0000 ug | INTRAMUSCULAR | Status: DC
  Administered 2019-11-30 (×2): 50 ug via INTRAVENOUS
  Filled 2019-11-30: qty 2

## 2019-11-30 MED ORDER — OXYCODONE HCL 5 MG PO TABS
5.0000 mg | ORAL_TABLET | ORAL | Status: DC | PRN
  Administered 2019-11-30: 10 mg via ORAL

## 2019-11-30 MED ORDER — HYDROMORPHONE HCL 1 MG/ML IJ SOLN
INTRAMUSCULAR | Status: AC
  Filled 2019-11-30: qty 1

## 2019-11-30 MED ORDER — BUPIVACAINE HCL (PF) 0.5 % IJ SOLN
INTRAMUSCULAR | Status: AC
  Filled 2019-11-30: qty 30

## 2019-11-30 MED ORDER — SODIUM CHLORIDE 0.9 % IR SOLN
Status: DC | PRN
  Administered 2019-11-30: 18000 mL

## 2019-11-30 MED ORDER — HYDROMORPHONE HCL 1 MG/ML IJ SOLN
0.5000 mg | INTRAMUSCULAR | Status: DC | PRN
  Administered 2019-11-30: 0.5 mg via INTRAVENOUS

## 2019-11-30 MED ORDER — DEXAMETHASONE SODIUM PHOSPHATE 10 MG/ML IJ SOLN
INTRAMUSCULAR | Status: DC | PRN
  Administered 2019-11-30: 10 mg via INTRAVENOUS

## 2019-11-30 MED ORDER — FENTANYL CITRATE (PF) 100 MCG/2ML IJ SOLN
25.0000 ug | INTRAMUSCULAR | Status: DC | PRN
  Administered 2019-11-30 (×2): 50 ug via INTRAVENOUS

## 2019-11-30 MED ORDER — 0.9 % SODIUM CHLORIDE (POUR BTL) OPTIME
TOPICAL | Status: DC | PRN
  Administered 2019-11-30: 1000 mL

## 2019-11-30 MED ORDER — EPINEPHRINE PF 1 MG/ML IJ SOLN
INTRAMUSCULAR | Status: AC
  Filled 2019-11-30: qty 1

## 2019-11-30 MED ORDER — PROPOFOL 10 MG/ML IV BOLUS
INTRAVENOUS | Status: AC
  Filled 2019-11-30: qty 20

## 2019-11-30 MED ORDER — CLONIDINE HCL (ANALGESIA) 100 MCG/ML EP SOLN
EPIDURAL | Status: DC | PRN
  Administered 2019-11-30: 100 ug

## 2019-11-30 MED ORDER — FENTANYL CITRATE (PF) 100 MCG/2ML IJ SOLN
INTRAMUSCULAR | Status: DC | PRN
  Administered 2019-11-30 (×2): 50 ug via INTRAVENOUS
  Administered 2019-11-30: 100 ug via INTRAVENOUS

## 2019-11-30 MED ORDER — KETAMINE HCL 10 MG/ML IJ SOLN
INTRAMUSCULAR | Status: AC
  Filled 2019-11-30: qty 1

## 2019-11-30 MED ORDER — LIDOCAINE 2% (20 MG/ML) 5 ML SYRINGE
INTRAMUSCULAR | Status: AC
  Filled 2019-11-30: qty 5

## 2019-11-30 MED ORDER — PROPOFOL 500 MG/50ML IV EMUL
INTRAVENOUS | Status: DC | PRN
  Administered 2019-11-30: 200 mg via INTRAVENOUS

## 2019-11-30 MED ORDER — KETAMINE HCL 10 MG/ML IJ SOLN
INTRAMUSCULAR | Status: DC | PRN
  Administered 2019-11-30: 40 mg via INTRAVENOUS

## 2019-11-30 MED ORDER — ROPIVACAINE HCL 7.5 MG/ML IJ SOLN
INTRAMUSCULAR | Status: DC | PRN
  Administered 2019-11-30 (×4): 5 mL via PERINEURAL

## 2019-11-30 SURGICAL SUPPLY — 86 items
ANCHOR BUTTON TIGHTROPE 14 (Button) ×3 IMPLANT
ANCHOR BUTTON TIGHTROPE II FT (Plate) ×3 IMPLANT
ANCHOR BUTTON TIGHTROPE II OP (Plate) ×3 IMPLANT
BANDAGE ESMARK 6X9 LF (GAUZE/BANDAGES/DRESSINGS) IMPLANT
BENZOIN TINCTURE PRP APPL 2/3 (GAUZE/BANDAGES/DRESSINGS) ×3 IMPLANT
BLADE EXCALIBUR 4.0MM X 13CM (MISCELLANEOUS) ×1
BLADE EXCALIBUR 4.0X13 (MISCELLANEOUS) ×2 IMPLANT
BLADE SURG 15 STRL LF DISP TIS (BLADE) ×1 IMPLANT
BLADE SURG 15 STRL SS (BLADE) ×3
BNDG ELASTIC 6X5.8 VLCR STR LF (GAUZE/BANDAGES/DRESSINGS) ×6 IMPLANT
BNDG ESMARK 6X9 LF (GAUZE/BANDAGES/DRESSINGS)
BURR OVAL 8 FLU 4.0MM X 13CM (MISCELLANEOUS)
BURR OVAL 8 FLU 4.0X13 (MISCELLANEOUS) IMPLANT
BURR OVAL 8 FLU 5.0MM X 13CM (MISCELLANEOUS) ×1
BURR OVAL 8 FLU 5.0X13 (MISCELLANEOUS) ×2 IMPLANT
CLOSURE WOUND 1/2 X4 (GAUZE/BANDAGES/DRESSINGS) ×1
COVER BACK TABLE 60X90IN (DRAPES) ×3 IMPLANT
COVER WAND RF STERILE (DRAPES) IMPLANT
DISSECTOR  3.8MM X 13CM (MISCELLANEOUS)
DISSECTOR 3.8MM X 13CM (MISCELLANEOUS) IMPLANT
DRAIN PENROSE 0.25X18 (DRAIN) ×3 IMPLANT
DRAPE ARTHROSCOPY W/POUCH 114 (DRAPES) ×3 IMPLANT
DRAPE C-ARM 42X120 X-RAY (DRAPES) ×3 IMPLANT
DRAPE OEC MINIVIEW 54X84 (DRAPES) ×3 IMPLANT
DRAPE TOP ARMCOVERS (MISCELLANEOUS) ×3 IMPLANT
DRSG EMULSION OIL 3X3 NADH (GAUZE/BANDAGES/DRESSINGS) ×3 IMPLANT
DRSG PAD ABDOMINAL 8X10 ST (GAUZE/BANDAGES/DRESSINGS) ×6 IMPLANT
DURAPREP 26ML APPLICATOR (WOUND CARE) ×3 IMPLANT
DW OUTFLOW CASSETTE/TUBE SET (MISCELLANEOUS) ×3 IMPLANT
ELECT REM PT RETURN 15FT ADLT (MISCELLANEOUS) IMPLANT
GAUZE SPONGE 4X4 12PLY STRL (GAUZE/BANDAGES/DRESSINGS) ×6 IMPLANT
GAUZE XEROFORM 1X8 LF (GAUZE/BANDAGES/DRESSINGS) ×3 IMPLANT
GLOVE BIOGEL PI IND STRL 8 (GLOVE) ×2 IMPLANT
GLOVE BIOGEL PI INDICATOR 8 (GLOVE) ×4
GLOVE ECLIPSE 7.5 STRL STRAW (GLOVE) ×6 IMPLANT
GOWN STRL REUS W/ TWL LRG LVL3 (GOWN DISPOSABLE) ×1 IMPLANT
GOWN STRL REUS W/ TWL XL LVL3 (GOWN DISPOSABLE) ×1 IMPLANT
GOWN STRL REUS W/TWL LRG LVL3 (GOWN DISPOSABLE) ×3
GOWN STRL REUS W/TWL XL LVL3 (GOWN DISPOSABLE) ×6 IMPLANT
HOLDER KNEE FOAM BLUE (MISCELLANEOUS) ×3 IMPLANT
IMMOBILIZER KNEE 22  40 CIR (ORTHOPEDIC SUPPLIES)
IMMOBILIZER KNEE 22 40 CIR (ORTHOPEDIC SUPPLIES) IMPLANT
IMMOBILIZER KNEE 22 UNIV (SOFTGOODS) ×3 IMPLANT
IV NS IRRIG 3000ML ARTHROMATIC (IV SOLUTION) ×6 IMPLANT
KIT ACL DISPOSABLE (KITS) ×3 IMPLANT
KIT BASIN OR (CUSTOM PROCEDURE TRAY) ×3 IMPLANT
KIT TRANSTIBIAL (DISPOSABLE) IMPLANT
MANIFOLD NEPTUNE II (INSTRUMENTS) ×3 IMPLANT
NDL SAFETY ECLIPSE 18X1.5 (NEEDLE) ×1 IMPLANT
NEEDLE HYPO 18GX1.5 SHARP (NEEDLE) ×3
PACK ARTHROSCOPY DSU (CUSTOM PROCEDURE TRAY) ×3 IMPLANT
PAD CAST 4YDX4 CTTN HI CHSV (CAST SUPPLIES) ×2 IMPLANT
PADDING CAST COTTON 4X4 STRL (CAST SUPPLIES) ×6
PADDING CAST COTTON 6X4 STRL (CAST SUPPLIES) ×6 IMPLANT
PASSER SUT SWANSON 36MM LOOP (INSTRUMENTS) IMPLANT
PENCIL SMOKE EVACUATOR (MISCELLANEOUS) IMPLANT
PIN DRILL ACL TIGHTROPE 4MM (PIN) IMPLANT
SPONGE LAP 4X18 RFD (DISPOSABLE) ×3 IMPLANT
STRIP CLOSURE SKIN 1/2X4 (GAUZE/BANDAGES/DRESSINGS) ×2 IMPLANT
SUCTION FRAZIER HANDLE 10FR (MISCELLANEOUS) ×3
SUCTION TUBE FRAZIER 10FR DISP (MISCELLANEOUS) ×1 IMPLANT
SUT FIBERWIRE #2 38 T-5 BLUE (SUTURE) ×6
SUT MNCRL AB 3-0 PS2 18 (SUTURE) ×3 IMPLANT
SUT PDS AB 1 CT  36 (SUTURE)
SUT PDS AB 1 CT 36 (SUTURE) IMPLANT
SUT VIC AB 0 CT1 27 (SUTURE) ×12
SUT VIC AB 0 CT1 27XBRD ANBCTR (SUTURE) ×3 IMPLANT
SUT VIC AB 0 CT1 27XBRD ANTBC (SUTURE) ×1 IMPLANT
SUT VIC AB 0 CT1 36 (SUTURE) ×3 IMPLANT
SUT VIC AB 0 SH 27 (SUTURE) IMPLANT
SUT VIC AB 2-0 CT1 27 (SUTURE) ×3
SUT VIC AB 2-0 CT1 TAPERPNT 27 (SUTURE) ×1 IMPLANT
SUT VIC AB 2-0 SH 27 (SUTURE) ×3
SUT VIC AB 2-0 SH 27XBRD (SUTURE) ×1 IMPLANT
SUTURE FIBERWR #2 38 T-5 BLUE (SUTURE) ×2 IMPLANT
SUTURE TAPE 1.3 FIBERLOP 20 ST (SUTURE) ×4 IMPLANT
SUTURE TAPE TIGERLINK 1.3MM BL (SUTURE) ×1 IMPLANT
SUTURETAPE 1.3 FIBERLOOP 20 ST (SUTURE) ×12
SUTURETAPE TIGERLINK 1.3MM BL (SUTURE) ×3
SYR 10ML LL (SYRINGE) ×3 IMPLANT
TISSUE GRAFTLINK FGL (Tissue) ×3 IMPLANT
TOWEL OR 17X26 10 PK STRL BLUE (TOWEL DISPOSABLE) ×6 IMPLANT
TUBING ARTHROSCOPY IRRIG 16FT (MISCELLANEOUS) ×3 IMPLANT
WATER STERILE IRR 1000ML POUR (IV SOLUTION) ×3 IMPLANT
WRAP KNEE MAXI GEL POST OP (GAUZE/BANDAGES/DRESSINGS) ×3 IMPLANT
YANKAUER SUCT BULB TIP NO VENT (SUCTIONS) IMPLANT

## 2019-11-30 NOTE — Brief Op Note (Signed)
11/30/2019  3:38 PM  PATIENT:  Susan Brennan  24 y.o. female  PRE-OPERATIVE DIAGNOSIS:  RIGHT KNEE ANTERIOR CRUCIATE LIGAMENT AND LATERAL MENISCAL TEAR  POST-OPERATIVE DIAGNOSIS:  RIGHT KNEE ANTERIOR CRUCIATE LIGAMENT AND   PROCEDURE:  Procedure(s): RECONSTRUCTION ANTERIOR CRUCIATE LIGAMENT (ACL) WITH HAMSTRING GRAFT (Right)  SURGEON:  Surgeon(s) and Role:    Jodi Geralds, MD - Primary  PHYSICIAN ASSISTANT:   ASSISTANTS: mike craig   ANESTHESIA:   general  EBL:  min   BLOOD ADMINISTERED:none  DRAINS: none   LOCAL MEDICATIONS USED:  NONE  SPECIMEN:  No Specimen  DISPOSITION OF SPECIMEN:  N/A  COUNTS:  YES  TOURNIQUET:  * No tourniquets in log *  DICTATION: .Other Dictation: Dictation Number [id number]  PLAN OF CARE: Discharge to home after PACU  PATIENT DISPOSITION:  PACU - hemodynamically stable.   Delay start of Pharmacological VTE agent (>24hrs) due to surgical blood loss or risk of bleeding: no

## 2019-11-30 NOTE — Anesthesia Procedure Notes (Signed)
Procedure Name: LMA Insertion Date/Time: 11/30/2019 1:36 PM Performed by: Basilio Cairo, CRNA Pre-anesthesia Checklist: Patient identified, Patient being monitored, Timeout performed, Emergency Drugs available and Suction available Patient Re-evaluated:Patient Re-evaluated prior to induction Oxygen Delivery Method: Circle system utilized Preoxygenation: Pre-oxygenation with 100% oxygen Induction Type: IV induction Ventilation: Mask ventilation without difficulty LMA: LMA inserted and LMA with gastric port inserted LMA Size: 5.0 Tube type: Oral Number of attempts: 1 Placement Confirmation: positive ETCO2 and breath sounds checked- equal and bilateral Tube secured with: Tape Dental Injury: Teeth and Oropharynx as per pre-operative assessment

## 2019-11-30 NOTE — Anesthesia Preprocedure Evaluation (Addendum)
Anesthesia Evaluation  Patient identified by MRN, date of birth, ID band Patient awake    Reviewed: Allergy & Precautions, NPO status , Patient's Chart, lab work & pertinent test results  Airway Mallampati: II       Dental no notable dental hx.    Pulmonary Patient abstained from smoking.,    Pulmonary exam normal        Cardiovascular negative cardio ROS Normal cardiovascular exam     Neuro/Psych    GI/Hepatic Neg liver ROS,   Endo/Other  Morbid obesity  Renal/GU negative Renal ROS  negative genitourinary   Musculoskeletal negative musculoskeletal ROS (+)   Abdominal (+) + obese,   Peds  Hematology negative hematology ROS (+)   Anesthesia Other Findings   Reproductive/Obstetrics                             Anesthesia Physical Anesthesia Plan  ASA: III  Anesthesia Plan: General   Post-op Pain Management:  Regional for Post-op pain   Induction:   PONV Risk Score and Plan: Ondansetron, Dexamethasone and Midazolam  Airway Management Planned: LMA  Additional Equipment: None  Intra-op Plan:   Post-operative Plan: Extubation in OR  Informed Consent: I have reviewed the patients History and Physical, chart, labs and discussed the procedure including the risks, benefits and alternatives for the proposed anesthesia with the patient or authorized representative who has indicated his/her understanding and acceptance.       Plan Discussed with: CRNA  Anesthesia Plan Comments:        Anesthesia Quick Evaluation

## 2019-11-30 NOTE — Transfer of Care (Signed)
Immediate Anesthesia Transfer of Care Note  Patient: Susan Brennan  Procedure(s) Performed: Procedure(s): RECONSTRUCTION ANTERIOR CRUCIATE LIGAMENT (ACL) WITH HAMSTRING GRAFT (Right)  Patient Location: PACU  Anesthesia Type:General  Level of Consciousness: Alert, Awake, Oriented  Airway & Oxygen Therapy: Patient Spontanous Breathing  Post-op Assessment: Report given to RN  Post vital signs: Reviewed and stable  Last Vitals:  Vitals:   11/30/19 1246 11/30/19 1251  BP: (!) 101/51 (!) 99/53  Pulse: 71 62  Resp: 17 17  Temp:    SpO2: 100% 100%    Complications: No apparent anesthesia complications

## 2019-11-30 NOTE — Anesthesia Postprocedure Evaluation (Signed)
Anesthesia Post Note  Patient: Susan Brennan  Procedure(s) Performed: RECONSTRUCTION ANTERIOR CRUCIATE LIGAMENT (ACL) WITH HAMSTRING GRAFT (Right )     Patient location during evaluation: PACU Anesthesia Type: General Level of consciousness: awake and sedated Pain management: pain level controlled Vital Signs Assessment: post-procedure vital signs reviewed and stable Respiratory status: spontaneous breathing Cardiovascular status: stable Postop Assessment: no apparent nausea or vomiting Anesthetic complications: no   No complications documented.  Last Vitals:  Vitals:   11/30/19 1715 11/30/19 1730  BP: (!) 110/54 (!) 113/55  Pulse: 63 (!) 56  Resp: 14 16  Temp:    SpO2: 96% 100%    Last Pain:  Vitals:   11/30/19 1730  TempSrc:   PainSc: 8                  [name] [address]

## 2019-11-30 NOTE — Progress Notes (Signed)
Assisted Dr. Leilani Able with Right Knee adductor canal block. Side rails up, monitors on throughout procedure. See vital signs in flow sheet. Tolerated Procedure well.

## 2019-11-30 NOTE — H&P (Signed)
A pre op hand p   Chief Complaint: Right knee pain and instability  HPI: Susan Brennan is a 24 y.o. female who presents for evaluation of right knee pain and instability. It has been present for greater than a month and has been worsening. She has failed conservative measures. Pain is rated as moderate.  Past Medical History:  Diagnosis Date  . Anxiety   . Depression   . GERD (gastroesophageal reflux disease)   . HA (headache)   . Morbid obesity (HCC)   . Prediabetes    Past Surgical History:  Procedure Laterality Date  . CHOLECYSTECTOMY N/A 04/12/2018   Procedure: LAPAROSCOPIC CHOLECYSTECTOMY WITH INTRAOPERATIVE CHOLANGIOGRAM;  Surgeon: Sheliah Hatch De Blanch, MD;  Location: [facility];  Service: General;  Laterality: N/A;   Social History   Socioeconomic History  . Marital status: Single    Spouse name: Not on file  . Number of children: Not on file  . Years of education: Not on file  . Highest education level: Not on file  Occupational History  . Not on file  Tobacco Use  . Smoking status: Never Smoker  . Smokeless tobacco: Never Used  Vaping Use  . Vaping Use: Every day  Substance and Sexual Activity  . Alcohol use: Yes    Comment: Occ   . Drug use: Yes    Types: Marijuana    Comment: 1 every 2 weeks  . Sexual activity: Yes    Partners: Male    Birth control/protection: None  Other Topics Concern  . Not on file  Social History Narrative  . Not on file   Social Determinants of Health   Financial Resource Strain:   . Difficulty of Paying Living Expenses: Not on file  Food Insecurity:   . Worried About Programme researcher, broadcasting/film/video in the Last Year: Not on file  . Ran Out of Food in the Last Year: Not on file  Transportation Needs:   . Lack of Transportation (Medical): Not on file  . Lack of Transportation (Non-Medical): Not on file  Physical Activity:   . Days of Exercise per Week: Not on file  . Minutes of Exercise per Session: Not on file  Stress:   . Feeling of  Stress : Not on file  Social Connections:   . Frequency of Communication with Friends and Family: Not on file  . Frequency of Social Gatherings with Friends and Family: Not on file  . Attends Religious Services: Not on file  . Active Member of Clubs or Organizations: Not on file  . Attends Banker Meetings: Not on file  . Marital Status: Not on file   Family History  Problem Relation Age of Onset  . Irritable bowel syndrome Mother   . Diabetes Father   . Heart disease Father   . Atrial fibrillation Father   . Colon cancer Neg Hx   . Stomach cancer Neg Hx   . Esophageal cancer Neg Hx   . Pancreatic cancer Neg Hx    Allergies  Allergen Reactions  . Iodine Itching and Rash  . Codeine Nausea And Vomiting  . Tramadol Other (See Comments)    Headaches   . Contrast Media [Iodinated Diagnostic Agents] Itching and Rash   Prior to Admission medications   Medication Sig Start Date End Date Taking? Authorizing Provider  acetaminophen (TYLENOL) 500 MG tablet Take 1000 mg every 8 hours as needed for pain.  This is your primary pain medication.  Alternate this with  ibuprofen.  Do not exceed 4000 mg of Tylenol per day can harm your liver.  You can buy this over-the-counter at any drugstore. Patient taking differently: Take 1,000 mg by mouth every 8 (eight) hours as needed (pain.).  04/13/18  Yes Sherrie George, PA-C  amoxicillin-clavulanate (AUGMENTIN) 875-125 MG tablet Take 1 tablet by mouth 2 (two) times daily. 11/19/19  Yes [provider]  diphenhydramine-acetaminophen (TYLENOL PM) 25-500 MG TABS tablet Take 2 tablets by mouth at bedtime.   Yes [provider]  ibuprofen (ADVIL) 200 MG tablet Take 800 mg by mouth every 6 (six) hours as needed for moderate pain.   Yes [provider]  loratadine-pseudoephedrine (CLARITIN-D 12-HOUR) 5-120 MG tablet Take 1 tablet by mouth daily as needed for allergies.   Yes [provider]  Melatonin 10 MG  TABS Take 30 mg by mouth at bedtime. Gummies    Yes [provider]  nystatin (MYCOSTATIN) 100000 UNIT/ML suspension Take 4 mLs by mouth 4 (four) times daily as needed (thrush).  11/19/19   [provider]     Positive ROS: None  All other systems have been reviewed and were otherwise negative with the exception of those mentioned in the HPI and as above.  Physical Exam: Vitals:   11/30/19 1246 11/30/19 1251  BP: (!) 101/51 (!) 99/53  Pulse: 71 62  Resp: 17 17  Temp:    SpO2: 100% 100%    General: Alert, no acute distress Cardiovascular: No pedal edema Respiratory: No cyanosis, no use of accessory musculature GI: No organomegaly, abdomen is soft and non-tender Skin: No lesions in the area of chief complaint Neurologic: Sensation intact distally Psychiatric: Patient is competent for consent with normal mood and affect Lymphatic: No axillary or cervical lymphadenopathy  MUSCULOSKELETAL: Right knee: Positive pivot shift, positive Lachman, positive lateral joint line tenderness.  Positive McMurray.  MRI: MRI shows ACL tear with posterior horn lateral meniscal tear  Assessment/Plan: RIGHT KNEE ANTERIOR CRUCIATE LIGAMENT AND LATERAL MENISCAL TEAR Plan for Procedure(s): RECONSTRUCTION ANTERIOR CRUCIATE LIGAMENT (ACL) WITH HAMSTRING GRAFT  The risks benefits and alternatives were discussed with the patient including but not limited to the risks of nonoperative treatment, versus surgical intervention including infection, bleeding, nerve injury, malunion, nonunion, hardware prominence, hardware failure, need for hardware removal, blood clots, cardiopulmonary complications, morbidity, mortality, among others, and they were willing to proceed.  Predicted outcome is good, although there will be at least a six to nine month expected recovery.  Harvie Junior, MD 11/30/2019 1:08 PM

## 2019-11-30 NOTE — Anesthesia Procedure Notes (Signed)
Anesthesia Regional Block: Adductor canal block   Pre-Anesthetic Checklist: ,, timeout performed, Correct Patient, Correct Site, Correct Laterality, Correct Procedure, Correct Position, site marked, Risks and benefits discussed,  Surgical consent,  Pre-op evaluation,  At surgeon's request and post-op pain management  Laterality: Lower and Right  Prep: chloraprep       Needles:  Injection technique: Single-shot  Needle Type: Echogenic Stimulator Needle     Needle Length: 9cm  Needle Gauge: 20   Needle insertion depth: 4 cm   Additional Needles:   Procedures:,,,, ultrasound used (permanent image in chart),,,,  Narrative:  Start time: 11/30/2019 11:45 AM End time: 11/30/2019 11:55 AM Injection made incrementally with aspirations every 5 mL.  Performed by: Personally  Anesthesiologist: Leilani Able, MD

## 2019-11-30 NOTE — Discharge Instructions (Signed)
1. You may begin bearing weight immediately.  You need to have the knee immobilizer on while doing so. 2. You should begin heel slides and bending your knee without bearing weight as soon as possible.  Do this several times daily.  You should also raise your leg with the knee locked in extension several times daily. 3. Elevate and ice the knee several times daily. 4.  Keep bandaging in place until we see you in clinic tomorrow. 5.  Post operative pain medicine and a stool softener were ordered for you to pick up at [facility] on [address].

## 2019-12-01 ENCOUNTER — Encounter (HOSPITAL_COMMUNITY): Payer: Self-pay | Admitting: Orthopedic Surgery

## 2019-12-01 NOTE — Op Note (Signed)
NAME: NADALIE, LAUGHNER MEDICAL RECORD [id number] ACCOUNT [id number] DATE OF BIRTH:[birth year] FACILITY: [facility] LOCATION: [facility] PH[facility]ICIAN:Zillah Alexie L. Vadis Slabach, MD  OPERATIVE REPORT  DATE OF PROCEDURE:  11/30/2019  PREOPERATIVE DIAGNOSIS:  Anterior cruciate ligament tear with posterior horn lateral meniscal tear.  POSTOPERATIVE DIAGNOSES:  Anterior cruciate ligament tear with posterior horn lateral meniscal tear.  PROCEDURES: 1.  Anterior cruciate ligament reconstruction with a hamstring allograft. 2.  Partial posterior horn lateral meniscectomy.  SURGEON:  Jodi Geralds, MD  ASSISTANT:  Devoria Glassing, PA-C was present for the entire case and assisted by retraction of the leg, manipulation of tissues, and closing to minimize OR time.  BRIEF HISTORY:  Ms. Araque is a 24 year old female with a long history of significant complaints of right knee pain.  She had a twisting injury and ultimately tore ACL.  MRI was confirmatory and showed also a posterior horn lateral meniscal tear with a  parrot beak type tear.  She was brought to the operating room for evaluation and fixation.  DESCRIPTION OF PROCEDURE:  The patient brought to the operating room after adequate anesthesia was obtained with a general anesthetic, the patient was supine on the operating table, right leg was prepped and draped in usual sterile fashion.  Following  this, a routine arthroscopic examination of knee revealed there was an obvious anterior cruciate ligament tear and the GraftLink graft was opened and began to be prepared by Devoria Glassing, graftologist on the back table.  Attention was then turned  towards the knee where the patellofemoral joint was within normal limits.  Attention turned to the medial side where the medial femoral condyle and medial meniscus was within normal limits.  Attention was turned laterally where there was a parrot beak  posterior horn lateral meniscal tear was debrided with a combination of straight  biting forceps and upbiting forceps and remaining meniscal rim was contoured down with a suction shaver.  We took out probably 5% of the posterior horn of the meniscus.   Once this was completed, attention was turned back towards the notch and the stump of the ACL was debrided and a notchplasty was performed with a shaver and bur.  Once we had adequate notchplasty, we used a guide at the tibia and the footprint of the old  ACL.  I made a small incision distal to the medial portal and dissected down to bone.  We then passed a guidewire into the old footprint of the ACL and overreamed it with a 10 mm reamer, which was the size of the graft.  Once this was done, we then  turned towards over the top position and put a guidewire out the distal lateral femur.  Once that was done, this was overreamed to a 17 mm with a 10 mm reamer.  We then used a 4 mm reamer to go all the way out the side.  Once that was done, the graft was  advanced into the knee.  We struggled a little bit with getting 4 images with the mini C-arm because of her size and ultimately had to bring in the big C-arm and we were able to ultimately see the Endobutton, passed it out the lateral femur and then  seated it back against the femoral cortex, we then hoisted the graft up into place under direct vision.  Past we took the 14 mm cover down at the bottom and then tied.  Once we were certain that we were up against the bone and we have hoisted well  into  the tunnel, we then tied the graft distally.  We then rehoisted up into the tunnel.  Once we were certain of that we then took the shaver and cleaned up all in front of the graft so that we could get easily into full extension, took a final check medial  and lateral, everything looked good.  There were no fragments of bone or bone issues.  At that point, the notch where that had been tied previously were now cut and the sutures were removed from the upper end of the graft and it was cut.  The  entrance  for the ACL was then closed with 0 Vicryl, 2-0 Vicryl and 3-0 Monocryl subcuticular.  Benzoin and Steri-Strips were applied.  Sterile compressive dressing was applied.  The patient was taken to recovery in significant satisfactory condition with a knee  immobilizer in place.  She will be discharged home tonight and I will see her in the office tomorrow to go over her postoperative management.  I talked to her family and will let her to start to range the knee tonight as well as full weightbearing with  the brace in place.  Of note, Devoria Glassing assisted throughout the case, he prepared the graft, he retracted tissues, manipulated the leg and closed to minimize OR time.  Estimated blood loss for procedure was minimal.  There was no tourniquet used.  HN/NUANCE  D:11/30/2019 T:12/01/2019 JOB:[id number]

## 2020-01-03 ENCOUNTER — Encounter (HOSPITAL_COMMUNITY): Payer: Self-pay

## 2020-01-03 ENCOUNTER — Ambulatory Visit (HOSPITAL_COMMUNITY)
Admission: EM | Admit: 2020-01-03 | Discharge: 2020-01-03 | Disposition: A | Discharge status: Home / Self Care | Payer: Managed Care, Other (non HMO) | Attending: Family Medicine | Admitting: Family Medicine

## 2020-01-03 ENCOUNTER — Other Ambulatory Visit: Payer: Self-pay

## 2020-01-03 DIAGNOSIS — K529 Noninfective gastroenteritis and colitis, unspecified: Secondary | ICD-10-CM

## 2020-01-03 LAB — POCT URINALYSIS DIPSTICK, ED / UC
Bilirubin Urine: NEGATIVE
Glucose, UA: NEGATIVE mg/dL
Ketones, ur: NEGATIVE mg/dL
Leukocytes,Ua: NEGATIVE
Nitrite: NEGATIVE
Protein, ur: NEGATIVE mg/dL
Specific Gravity, Urine: 1.025 (ref 1.005–1.030)
Urobilinogen, UA: 0.2 mg/dL (ref 0.0–1.0)
pH: 6.5 (ref 5.0–8.0)

## 2020-01-03 LAB — POC URINE PREG, ED: Preg Test, Ur: NEGATIVE

## 2020-01-03 MED ORDER — PROMETHAZINE HCL 25 MG PO TABS: 25.0000 mg | ORAL_TABLET | Freq: Four times a day (QID) | ORAL | 0 refills | Status: AC | PRN

## 2020-01-03 NOTE — ED Triage Notes (Signed)
Pt presents with stomach pain, nausea, vomiting and diarrhea X 5 days. Pt states she has been dizzy x 1 day. Pt states she has taken OTC medicine to treat diarrhea.

## 2020-01-03 NOTE — Discharge Instructions (Signed)
For your gastroenteritis, continue to take Immodium for diarrhea. Also start the Phenergan for nausea. Drink plenty of water. You can also try eating bland foods like bananas, applesauce, toast, etc.   If you continue to have back pain and stomach discomfort despite this treatment plan- or if the pain gets worse- head to the ED for further evaluation.

## 2020-01-03 NOTE — ED Provider Notes (Signed)
[facility]    CSN: [id number] Arrival date & time: 01/03/20  1821      History   Chief Complaint Chief Complaint  Patient presents with  . Abdominal Pain  . Nausea  . Vomiting  . Diarrhea    HPI Susan Brennan is a 24 y.o. female presenting with RUQ stomach pain, nausea, vomiting, and diarrhea for 5 days. History of cholecystectomy but does still have her appendix. Vomiting 1-2x daily and 5 episodes of diarrhea daily despite immodium. She's able to keep down some water but feels that she's dehydrated. Has been dizzy when standing today but denies syncope. Not sexually active and denies chance she could be pregnant. Endorses right sided back pain. Denies hematuria, dysuria, frequency, urgency, fevers/chills, abdnormal vaginal discharge.    HPI  Past Medical History:  Diagnosis Date  . Anxiety   . Depression   . GERD (gastroesophageal reflux disease)   . HA (headache)   . Morbid obesity (HCC)   . Prediabetes     Patient Active Problem List   Diagnosis Date Noted  . S/P ACL reconstruction 11/30/2019  . Complete tear of anterior cruciate ligament of right knee 11/30/2019  . Cholecystitis with cholelithiasis 04/11/2018  . Anxiety     Past Surgical History:  Procedure Laterality Date  . ANTERIOR CRUCIATE LIGAMENT REPAIR Right 11/30/2019   Procedure: RECONSTRUCTION ANTERIOR CRUCIATE LIGAMENT (ACL) WITH HAMSTRING GRAFT;  Surgeon: Jodi Geralds, MD;  Location: [facility];  Service: Orthopedics;  Laterality: Right;  . CHOLECYSTECTOMY N/A 04/12/2018   Procedure: LAPAROSCOPIC CHOLECYSTECTOMY WITH INTRAOPERATIVE CHOLANGIOGRAM;  Surgeon: Sheliah Hatch De Blanch, MD;  Location: [facility];  Service: General;  Laterality: N/A;    OB History   No obstetric history on file.      Home Medications    Prior to Admission medications   Medication Sig Start Date End Date Taking? Authorizing Provider  acetaminophen (TYLENOL) 500 MG tablet Take 1000 mg every 8 hours as needed for  pain.  This is your primary pain medication.  Alternate this with ibuprofen.  Do not exceed 4000 mg of Tylenol per day can harm your liver.  You can buy this over-the-counter at any drugstore. Patient taking differently: Take 1,000 mg by mouth every 8 (eight) hours as needed (pain.).  04/13/18   Sherrie George, PA-C  amoxicillin-clavulanate (AUGMENTIN) 875-125 MG tablet Take 1 tablet by mouth 2 (two) times daily. 11/19/19   [provider]  diphenhydramine-acetaminophen (TYLENOL PM) 25-500 MG TABS tablet Take 2 tablets by mouth at bedtime.    [provider]  ibuprofen (ADVIL) 200 MG tablet Take 800 mg by mouth every 6 (six) hours as needed for moderate pain.    [provider]  loratadine-pseudoephedrine (CLARITIN-D 12-HOUR) 5-120 MG tablet Take 1 tablet by mouth daily as needed for allergies.    [provider]  Melatonin 10 MG TABS Take 30 mg by mouth at bedtime. Gummies     [provider]  nystatin (MYCOSTATIN) 100000 UNIT/ML suspension Take 4 mLs by mouth 4 (four) times daily as needed (thrush).  11/19/19   [provider]    Family History Family History  Problem Relation Age of Onset  . Irritable bowel syndrome Mother   . Diabetes Father   . Heart disease Father   . Atrial fibrillation Father   . Colon cancer Neg Hx   . Stomach cancer Neg Hx   . Esophageal cancer Neg Hx   . Pancreatic cancer Neg Hx  Social History Social History   Tobacco Use  . Smoking status: Never Smoker  . Smokeless tobacco: Never Used  Vaping Use  . Vaping Use: Every day  Substance Use Topics  . Alcohol use: Yes    Comment: Occ   . Drug use: Yes    Types: Marijuana    Comment: 1 every 2 weeks     Allergies   Iodine, Codeine, Tramadol, and Contrast media [iodinated diagnostic agents]   Review of Systems Review of Systems  Constitutional: Negative for chills and fever.  HENT: Negative for congestion.   Respiratory: Negative for  cough, chest tightness and shortness of breath.   Cardiovascular: Negative for chest pain.  Gastrointestinal: Positive for abdominal pain, diarrhea, nausea and vomiting. Negative for abdominal distention, blood in stool and constipation.  Genitourinary: Negative for dysuria, flank pain, frequency, hematuria, urgency, vaginal bleeding and vaginal discharge.     Physical Exam Triage Vital Signs ED Triage Vitals  Enc Vitals Group     BP 01/03/20 1919 (!) 153/97     Pulse Rate 01/03/20 1919 95     Resp 01/03/20 1919 18     Temp 01/03/20 1919 98.1 F (36.7 C)     Temp Source 01/03/20 1919 Oral     SpO2 01/03/20 1919 99 %     Weight --      Height --      Head Circumference --      Peak Flow --      Pain Score 01/03/20 1917 3     Pain Loc --      Pain Edu? --      Excl. in GC? --    No data found.  Updated Vital Signs BP (!) 153/97 (BP Location: Right Arm)   Pulse 95   Temp 98.1 F (36.7 C) (Oral)   Resp 18   LMP 12/26/2019 (Exact Date)   SpO2 99%   Visual Acuity Right Eye Distance:   Left Eye Distance:   Bilateral Distance:    Right Eye Near:   Left Eye Near:    Bilateral Near:     Physical Exam Vitals reviewed.  Constitutional:      General: She is not in acute distress.    Appearance: Normal appearance. She is obese. She is not ill-appearing.  HENT:     Head: Normocephalic and atraumatic.     Mouth/Throat:     Mouth: Mucous membranes are moist.  Cardiovascular:     Rate and Rhythm: Normal rate and regular rhythm.     Heart sounds: Normal heart sounds.  Pulmonary:     Effort: Pulmonary effort is normal.     Breath sounds: Normal breath sounds. No wheezing, rhonchi or rales.  Abdominal:     General: Bowel sounds are normal. There is no distension.     Palpations: Abdomen is soft. There is no mass.     Tenderness: There is abdominal tenderness in the right upper quadrant. There is right CVA tenderness. There is no left CVA tenderness, guarding or rebound.  Negative signs include Rovsing's sign and McBurney's sign.     Comments: Bowel sounds positive in all 4 quadrants.. Negative Rovsing's sign, McBurney point tenderness.   Neurological:     General: No focal deficit present.     Mental Status: She is alert and oriented to person, place, and time.  Psychiatric:        Mood and Affect: Mood normal.        Behavior:  Behavior normal.      UC Treatments / Results  Labs (all labs ordered are listed, but only abnormal results are displayed) Labs Reviewed  POCT URINALYSIS DIPSTICK, ED / UC  POC URINE PREG, ED    EKG   Radiology No results found.  Procedures Procedures (including critical care time)  Medications Ordered in UC Medications - No data to display  Initial Impression / Assessment and Plan / UC Course  I have reviewed the triage vital signs and the nursing notes.  Pertinent labs & imaging results that were available during my care of the patient were reviewed by me and considered in my medical decision making (see chart for details).     Pt does not have her gallbladder but does still have her appendix.   UA today showing trace blood but otherwise wnl. Negative pregnancy test.   Reassurance proivded that symptoms are consistent with viral gastroenteritis. Script sent for Phenergan, and she will continue immodium at home. rec good hydration and BRAT diet.   Return precautions- chest pain, shortness of breath, new/worsening fevers/chills, confusion, worsening of symptoms despite the above treatment plan, etc.    Final Clinical Impressions(s) / UC Diagnoses   Final diagnoses:  None   Discharge Instructions   None    ED Prescriptions    None     PDMP not reviewed this encounter.   Rhys Martini, PA-C 01/03/20 2053

## 2020-03-24 ENCOUNTER — Encounter (HOSPITAL_BASED_OUTPATIENT_CLINIC_OR_DEPARTMENT_OTHER): Payer: Self-pay | Admitting: *Deleted

## 2020-03-24 ENCOUNTER — Other Ambulatory Visit: Payer: Self-pay

## 2020-03-24 ENCOUNTER — Emergency Department (HOSPITAL_BASED_OUTPATIENT_CLINIC_OR_DEPARTMENT_OTHER)
Admission: EM | Admit: 2020-03-24 | Discharge: 2020-03-25 | Disposition: A | Discharge status: Home / Self Care | Payer: Managed Care, Other (non HMO) | Attending: Emergency Medicine | Admitting: Emergency Medicine

## 2020-03-24 DIAGNOSIS — K219 Gastro-esophageal reflux disease without esophagitis: Secondary | ICD-10-CM | POA: Diagnosis not present

## 2020-03-24 DIAGNOSIS — N3091 Cystitis, unspecified with hematuria: Secondary | ICD-10-CM | POA: Diagnosis not present

## 2020-03-24 DIAGNOSIS — N760 Acute vaginitis: Secondary | ICD-10-CM | POA: Insufficient documentation

## 2020-03-24 DIAGNOSIS — R300 Dysuria: Secondary | ICD-10-CM | POA: Diagnosis present

## 2020-03-24 DIAGNOSIS — K5900 Constipation, unspecified: Secondary | ICD-10-CM | POA: Insufficient documentation

## 2020-03-24 MED ORDER — SODIUM CHLORIDE 0.9 % IV BOLUS
1000.0000 mL | Freq: Once | INTRAVENOUS | Status: AC
  Administered 2020-03-25: 1000 mL via INTRAVENOUS

## 2020-03-24 NOTE — ED Provider Notes (Signed)
[facility] Provider Note: Lowella Dell, MD, FACEP  CSN: [id number] MRN: [id number] ARRIVAL: 03/24/20 at 2144 ROOM: MH07/MH07   CHIEF COMPLAINT  Abdominal Pain   HISTORY OF PRESENT ILLNESS  03/24/20 11:23 PM Susan Brennan is a 25 y.o. female with 4 days of dysuria (burning with urination), suprapubic pain, constipation and difficulty voiding.  She was seen at an [place] clinic yesterday and diagnosed with a UTI.  She was started on Cipro, Pyridium and hydrocodone but her symptoms have since worsened.  She rates her suprapubic pain is an 8 out of 10, worse with movement or palpation.  She thinks she may have had a fever earlier in the week but not recently.  A photograph of her urine from yesterday shows it to be the color of iced tea.  She states this was true even before she started taking Pyridium.  Her physician also started her on topical lidocaine for vulvovaginal pain.   Past Medical History:  Diagnosis Date  . Anxiety   . Depression   . GERD (gastroesophageal reflux disease)   . HA (headache)   . Morbid obesity (HCC)   . Prediabetes     Past Surgical History:  Procedure Laterality Date  . ANTERIOR CRUCIATE LIGAMENT REPAIR Right 11/30/2019   Procedure: RECONSTRUCTION ANTERIOR CRUCIATE LIGAMENT (ACL) WITH HAMSTRING GRAFT;  Surgeon: Jodi Geralds, MD;  Location: [facility];  Service: Orthopedics;  Laterality: Right;  . CHOLECYSTECTOMY N/A 04/12/2018   Procedure: LAPAROSCOPIC CHOLECYSTECTOMY WITH INTRAOPERATIVE CHOLANGIOGRAM;  Surgeon: Sheliah Hatch De Blanch, MD;  Location: [facility];  Service: General;  Laterality: N/A;    Family History  Problem Relation Age of Onset  . Irritable bowel syndrome Mother   . Diabetes Father   . Heart disease Father   . Atrial fibrillation Father   . Colon cancer Neg Hx   . Stomach cancer Neg Hx   . Esophageal cancer Neg Hx   . Pancreatic cancer Neg Hx     Social History   Tobacco Use  . Smoking status: Never Smoker  .  Smokeless tobacco: Never Used  Vaping Use  . Vaping Use: Every day  Substance Use Topics  . Alcohol use: Yes    Comment: Occ   . Drug use: Yes    Types: Marijuana    Comment: 1 every 2 weeks    Prior to Admission medications   Medication Sig Start Date End Date Taking? Authorizing Provider  diphenhydramine-acetaminophen (TYLENOL PM) 25-500 MG TABS tablet Take 2 tablets by mouth at bedtime.    [provider]  ibuprofen (ADVIL) 200 MG tablet Take 800 mg by mouth every 6 (six) hours as needed for moderate pain.    [provider]  loratadine-pseudoephedrine (CLARITIN-D 12-HOUR) 5-120 MG tablet Take 1 tablet by mouth daily as needed for allergies.    [provider]  Melatonin 10 MG TABS Take 30 mg by mouth at bedtime. Gummies     [provider]  nystatin (MYCOSTATIN) 100000 UNIT/ML suspension Take 4 mLs by mouth 4 (four) times daily as needed (thrush).  11/19/19   [provider]  promethazine (PHENERGAN) 25 MG tablet Take 1 tablet (25 mg total) by mouth every 6 (six) hours as needed for nausea or vomiting. 01/03/20   Rhys Martini, PA-C    Allergies Iodine, Codeine, Tramadol, and Contrast media [iodinated diagnostic agents]   REVIEW OF SYSTEMS  Negative except as noted here or in the History of Present Illness.   PHYSICAL  EXAMINATION  Initial Vital Signs Blood pressure (!) 111/7, pulse (!) 118, temperature 98.2 F (36.8 C), temperature source Oral, resp. rate 16, height 5\' 8"  (1.727 m), weight (!) 157.9 kg, last menstrual period 03/10/2020, SpO2 100 %.  Examination General: Well-developed, well-nourished female in no acute distress; appearance consistent with age of record HENT: normocephalic; atraumatic Eyes: pupils equal, round and reactive to light; extraocular muscles intact Neck: supple Heart: regular rate and rhythm Lungs: clear to auscultation bilaterally Abdomen: soft; nondistended; suprapubic tenderness; bowel sounds  present GU: Bilateral mild CVA tenderness; inflammation of the vulvovaginal mucosae with stool-like material adherent to the labia and yellow, watery vaginal discharge Extremities: No deformity; full range of motion; pulses normal Neurologic: Awake, alert and oriented; motor function intact in all extremities and symmetric; no facial droop Skin: Warm and dry Psychiatric: Normal mood and affect   RESULTS  Summary of this visit's results, reviewed and interpreted by myself:   EKG Interpretation  Date/Time:    Ventricular Rate:    PR Interval:    QRS Duration:   QT Interval:    QTC Calculation:   R Axis:     Text Interpretation:        Laboratory Studies: Results for orders placed or performed during the hospital encounter of 03/24/20 (from the past 24 hour(s))  CBC with Differential/Platelet     Status: None   Collection Time: 03/25/20 12:08 AM  Result Value Ref Range   WBC 8.0 4.0 - 10.5 K/uL   RBC 4.37 3.87 - 5.11 MIL/uL   Hemoglobin 14.0 12.0 - 15.0 g/dL   HCT 05/25/20 82.8 - 00.3 %   MCV 95.0 80.0 - 100.0 fL   MCH 32.0 26.0 - 34.0 pg   MCHC 33.7 30.0 - 36.0 g/dL   RDW 49.1 79.1 - 50.5 %   Platelets 311 150 - 400 K/uL   nRBC 0.0 0.0 - 0.2 %   Neutrophils Relative % 59 %   Neutro Abs 4.7 1.7 - 7.7 K/uL   Lymphocytes Relative 34 %   Lymphs Abs 2.7 0.7 - 4.0 K/uL   Monocytes Relative 5 %   Monocytes Absolute 0.4 0.1 - 1.0 K/uL   Eosinophils Relative 1 %   Eosinophils Absolute 0.1 0.0 - 0.5 K/uL   Basophils Relative 1 %   Basophils Absolute 0.0 0.0 - 0.1 K/uL   Immature Granulocytes 0 %   Abs Immature Granulocytes 0.01 0.00 - 0.07 K/uL  Basic metabolic panel     Status: Abnormal   Collection Time: 03/25/20 12:08 AM  Result Value Ref Range   Sodium 138 135 - 145 mmol/L   Potassium 3.9 3.5 - 5.1 mmol/L   Chloride 100 98 - 111 mmol/L   CO2 27 22 - 32 mmol/L   Glucose, Bld 88 70 - 99 mg/dL   BUN 10 6 - 20 mg/dL   Creatinine, Ser 05/25/20 0.44 - 1.00 mg/dL   Calcium 8.7  (L) 8.9 - 10.3 mg/dL   GFR, Estimated 9.48 >01 mL/min   Anion gap 11 5 - 15  CK     Status: None   Collection Time: 03/25/20 12:08 AM  Result Value Ref Range   Total CK 52 38 - 234 U/L  hCG, quantitative, pregnancy     Status: None   Collection Time: 03/25/20 12:08 AM  Result Value Ref Range   hCG, Beta Chain, Quant, S <1 <5 mIU/mL  Urinalysis, Routine w reflex microscopic     Status: Abnormal  Collection Time: 03/25/20  2:00 AM  Result Value Ref Range   Color, Urine ORANGE (A) YELLOW   APPearance TURBID (A) CLEAR   Specific Gravity, Urine  1.005 - 1.030    TEST NOT REPORTED DUE TO COLOR INTERFERENCE OF URINE PIGMENT   pH  5.0 - 8.0    TEST NOT REPORTED DUE TO COLOR INTERFERENCE OF URINE PIGMENT   Glucose, UA (A) NEGATIVE mg/dL    TEST NOT REPORTED DUE TO COLOR INTERFERENCE OF URINE PIGMENT   Hgb urine dipstick (A) NEGATIVE    TEST NOT REPORTED DUE TO COLOR INTERFERENCE OF URINE PIGMENT   Bilirubin Urine (A) NEGATIVE    TEST NOT REPORTED DUE TO COLOR INTERFERENCE OF URINE PIGMENT   Ketones, ur (A) NEGATIVE mg/dL    TEST NOT REPORTED DUE TO COLOR INTERFERENCE OF URINE PIGMENT   Protein, ur (A) NEGATIVE mg/dL    TEST NOT REPORTED DUE TO COLOR INTERFERENCE OF URINE PIGMENT   Nitrite (A) NEGATIVE    TEST NOT REPORTED DUE TO COLOR INTERFERENCE OF URINE PIGMENT   Leukocytes,Ua (A) NEGATIVE    TEST NOT REPORTED DUE TO COLOR INTERFERENCE OF URINE PIGMENT  Wet prep, genital     Status: Abnormal   Collection Time: 03/25/20  2:00 AM   Specimen: Genital  Result Value Ref Range   Yeast Wet Prep HPF POC NONE SEEN NONE SEEN   Trich, Wet Prep NONE SEEN NONE SEEN   Clue Cells Wet Prep HPF POC PRESENT (A) NONE SEEN   WBC, Wet Prep HPF POC RARE (A) NONE SEEN   Sperm NONE SEEN   Urinalysis, Microscopic (reflex)     Status: Abnormal   Collection Time: 03/25/20  2:00 AM  Result Value Ref Range   RBC / HPF 0-5 0 - 5 RBC/hpf   WBC, UA 21-50 0 - 5 WBC/hpf   Bacteria, UA MANY (A) NONE SEEN    Squamous Epithelial / LPF 6-10 0 - 5   Imaging Studies: DG Abdomen 1 View  Result Date: 03/25/2020 CLINICAL DATA:  Constipation, lower abdominal and back pain for 1 week. EXAM: ABDOMEN - 1 VIEW COMPARISON:  Radiograph 02/23/2014 FINDINGS: Moderate colonic stool burden. Nonobstructive bowel gas pattern. No suspicious abdominal calculi. Osseous structures are unremarkable. IMPRESSION: Moderate colonic stool burden. No evidence of obstruction. Electronically Signed   By: Kreg Shropshire M.D.   On: 03/25/2020 02:38   CT Renal Stone Study  Result Date: 03/25/2020 CLINICAL DATA:  Hematuria EXAM: CT ABDOMEN AND PELVIS WITHOUT CONTRAST TECHNIQUE: Multidetector CT imaging of the abdomen and pelvis was performed following the standard protocol without IV contrast. COMPARISON:  08/05/2017 FINDINGS: Lower chest: The visualized lung bases are clear bilaterally. Cardiac size within normal limits. Trace pericardial effusion is likely physiologic. Hepatobiliary: No focal liver abnormality is seen. Status post cholecystectomy. No biliary dilatation. Pancreas: Unremarkable Spleen: Mild splenomegaly is stable with the spleen measuring 15.3 cm in greatest dimension. Adrenals/Urinary Tract: Adrenal glands are unremarkable. Kidneys are normal, without renal calculi, focal lesion, or hydronephrosis. Bladder is unremarkable. Stomach/Bowel: Stomach is within normal limits. Appendix appears normal. No evidence of bowel wall thickening, distention, or inflammatory changes. No free intraperitoneal gas or fluid. Vascular/Lymphatic: No significant vascular findings are present. No enlarged abdominal or pelvic lymph nodes. Reproductive: Uterus and bilateral adnexa are unremarkable. Other: No abdominal wall hernia.  Rectum is unremarkable. Musculoskeletal: No lytic or blastic bone lesions are identified. IMPRESSION: No acute intra-abdominal pathology identified. No radiographic explanation for the patient's reported symptoms.  Stable mild  splenomegaly. Electronically Signed   By: Helyn NumbersAshesh  Parikh MD   On: 03/25/2020 04:50    ED COURSE and MDM  Nursing notes, initial and subsequent vitals signs, including pulse oximetry, reviewed and interpreted by myself.  Vitals:   03/24/20 2222 03/24/20 2223 03/25/20 0129  BP: 111/67  (!) 100/50  Pulse: (!) 118  91  Resp: 16  20  Temp: 98.2 F (36.8 C)    TempSrc: Oral    SpO2: 100%  100%  Weight:  (!) 157.9 kg   Height:  5\' 8"  (1.727 m)    Medications  sodium chloride 0.9 % bolus 1,000 mL (0 mLs Intravenous Stopped 03/25/20 0128)   4:25 AM Urinalysis of no diagnostic use due to interfering pigment (blood?  Pyridium?).  CK is normal so I doubt rhabdomyolysis.  Will obtain CT renal stone study.   5:02 AM CT is unremarkable.  I doubt a kidney stone.  I suspect she has hemorrhagic cystitis which is causing her urine to be dark.  She was advised to follow-up with her PCP if symptoms persist.   PROCEDURES  Procedures   ED DIAGNOSES     ICD-10-CM   1. Hemorrhagic cystitis  N30.91   2. Vulvovaginitis  N76.0        Olena Willy, MD 03/25/20 [phone]

## 2020-03-24 NOTE — ED Triage Notes (Signed)
Pt reports lower abd pain and back pain x 1 week. She saw her PCP yesterday and was started on Cipro. States she is constipated and only "dribbling urine"

## 2020-03-24 NOTE — ED Notes (Signed)
Pt sts she feels generally weak x 1 wk.

## 2020-03-24 NOTE — ED Notes (Signed)
Attempted IV access left arm x2

## 2020-03-25 ENCOUNTER — Emergency Department (HOSPITAL_BASED_OUTPATIENT_CLINIC_OR_DEPARTMENT_OTHER): Payer: Managed Care, Other (non HMO)

## 2020-03-25 LAB — WET PREP, GENITAL
Sperm: NONE SEEN
Trich, Wet Prep: NONE SEEN
Yeast Wet Prep HPF POC: NONE SEEN

## 2020-03-25 LAB — URINALYSIS, ROUTINE W REFLEX MICROSCOPIC

## 2020-03-25 LAB — CK: Total CK: 52 U/L (ref 38–234)

## 2020-03-25 LAB — CBC WITH DIFFERENTIAL/PLATELET
Abs Immature Granulocytes: 0.01 10*3/uL (ref 0.00–0.07)
Basophils Absolute: 0 10*3/uL (ref 0.0–0.1)
Basophils Relative: 1 %
Eosinophils Absolute: 0.1 10*3/uL (ref 0.0–0.5)
Eosinophils Relative: 1 %
HCT: 41.5 % (ref 36.0–46.0)
Hemoglobin: 14 g/dL (ref 12.0–15.0)
Immature Granulocytes: 0 %
Lymphocytes Relative: 34 %
Lymphs Abs: 2.7 10*3/uL (ref 0.7–4.0)
MCH: 32 pg (ref 26.0–34.0)
MCHC: 33.7 g/dL (ref 30.0–36.0)
MCV: 95 fL (ref 80.0–100.0)
Monocytes Absolute: 0.4 10*3/uL (ref 0.1–1.0)
Monocytes Relative: 5 %
Neutro Abs: 4.7 10*3/uL (ref 1.7–7.7)
Neutrophils Relative %: 59 %
Platelets: 311 10*3/uL (ref 150–400)
RBC: 4.37 MIL/uL (ref 3.87–5.11)
RDW: 12 % (ref 11.5–15.5)
WBC: 8 10*3/uL (ref 4.0–10.5)
nRBC: 0 % (ref 0.0–0.2)

## 2020-03-25 LAB — BASIC METABOLIC PANEL
Anion gap: 11 (ref 5–15)
BUN: 10 mg/dL (ref 6–20)
CO2: 27 mmol/L (ref 22–32)
Calcium: 8.7 mg/dL — ABNORMAL LOW (ref 8.9–10.3)
Chloride: 100 mmol/L (ref 98–111)
Creatinine, Ser: 0.89 mg/dL (ref 0.44–1.00)
GFR, Estimated: >60 mL/min (ref >60)
Glucose, Bld: 88 mg/dL (ref 70–99)
Potassium: 3.9 mmol/L (ref 3.5–5.1)
Sodium: 138 mmol/L (ref 135–145)

## 2020-03-25 LAB — URINALYSIS, MICROSCOPIC (REFLEX)

## 2020-03-25 LAB — HCG, QUANTITATIVE, PREGNANCY: hCG, Beta Chain, Quant, S: <1 m[IU]/mL (ref <5)

## 2020-03-25 NOTE — ED Notes (Signed)
Attempted to void without success. Will reattempt.

## 2020-03-25 NOTE — ED Notes (Signed)
Attempted in and out cath without success - patient's vulva/labia red, inflamed and very tender to touch. Unable to tolerate in and out cath.

## 2020-03-26 LAB — URINE CULTURE: Culture: NO GROWTH

## 2020-12-26 ENCOUNTER — Other Ambulatory Visit: Payer: Self-pay | Admitting: Family Medicine

## 2020-12-26 ENCOUNTER — Ambulatory Visit
Admission: RE | Admit: 2020-12-26 | Discharge: 2020-12-26 | Disposition: A | Discharge status: Home / Self Care | Payer: Managed Care, Other (non HMO) | Source: Ambulatory Visit | Attending: Family Medicine | Admitting: Family Medicine

## 2020-12-26 DIAGNOSIS — R059 Cough, unspecified: Secondary | ICD-10-CM

## 2021-03-20 ENCOUNTER — Other Ambulatory Visit: Payer: Self-pay | Admitting: Obstetrics and Gynecology

## 2021-03-20 DIAGNOSIS — R109 Unspecified abdominal pain: Secondary | ICD-10-CM

## 2021-03-25 ENCOUNTER — Ambulatory Visit
Admission: RE | Admit: 2021-03-25 | Discharge: 2021-03-25 | Disposition: A | Discharge status: Home / Self Care | Payer: Managed Care, Other (non HMO) | Source: Ambulatory Visit | Attending: Obstetrics and Gynecology | Admitting: Obstetrics and Gynecology

## 2021-03-25 DIAGNOSIS — R109 Unspecified abdominal pain: Secondary | ICD-10-CM

## 2021-04-02 ENCOUNTER — Ambulatory Visit: Payer: Managed Care, Other (non HMO) | Admitting: Gastroenterology

## 2022-01-07 ENCOUNTER — Emergency Department (HOSPITAL_BASED_OUTPATIENT_CLINIC_OR_DEPARTMENT_OTHER)
Admission: EM | Admit: 2022-01-07 | Discharge: 2022-01-07 | Disposition: A | Discharge status: Home / Self Care | Payer: Managed Care, Other (non HMO) | Attending: Emergency Medicine | Admitting: Emergency Medicine

## 2022-01-07 ENCOUNTER — Emergency Department (HOSPITAL_BASED_OUTPATIENT_CLINIC_OR_DEPARTMENT_OTHER): Payer: Managed Care, Other (non HMO)

## 2022-01-07 ENCOUNTER — Encounter (HOSPITAL_BASED_OUTPATIENT_CLINIC_OR_DEPARTMENT_OTHER): Payer: Self-pay

## 2022-01-07 ENCOUNTER — Other Ambulatory Visit: Payer: Self-pay

## 2022-01-07 DIAGNOSIS — R1031 Right lower quadrant pain: Secondary | ICD-10-CM | POA: Diagnosis not present

## 2022-01-07 DIAGNOSIS — M545 Low back pain, unspecified: Secondary | ICD-10-CM | POA: Insufficient documentation

## 2022-01-07 DIAGNOSIS — Z20822 Contact with and (suspected) exposure to covid-19: Secondary | ICD-10-CM | POA: Insufficient documentation

## 2022-01-07 LAB — RESP PANEL BY RT-PCR (RSV, FLU A&B, COVID)  RVPGX2
Influenza A by PCR: NEGATIVE
Influenza B by PCR: NEGATIVE
Resp Syncytial Virus by PCR: NEGATIVE
SARS Coronavirus 2 by RT PCR: NEGATIVE

## 2022-01-07 LAB — COMPREHENSIVE METABOLIC PANEL
ALT: 15 U/L (ref 0–44)
AST: 15 U/L (ref 15–41)
Albumin: 4.4 g/dL (ref 3.5–5.0)
Alkaline Phosphatase: 69 U/L (ref 38–126)
Anion gap: 8 (ref 5–15)
BUN: 9 mg/dL (ref 6–20)
CO2: 27 mmol/L (ref 22–32)
Calcium: 9.4 mg/dL (ref 8.9–10.3)
Chloride: 104 mmol/L (ref 98–111)
Creatinine, Ser: 1.01 mg/dL — ABNORMAL HIGH (ref 0.44–1.00)
GFR, Estimated: >60 mL/min (ref >60)
Glucose, Bld: 97 mg/dL (ref 70–99)
Potassium: 4.1 mmol/L (ref 3.5–5.1)
Sodium: 139 mmol/L (ref 135–145)
Total Bilirubin: 0.5 mg/dL (ref 0.3–1.2)
Total Protein: 7.6 g/dL (ref 6.5–8.1)

## 2022-01-07 LAB — CBC
HCT: 42.9 % (ref 36.0–46.0)
Hemoglobin: 14.6 g/dL (ref 12.0–15.0)
MCH: 32.6 pg (ref 26.0–34.0)
MCHC: 34 g/dL (ref 30.0–36.0)
MCV: 95.8 fL (ref 80.0–100.0)
Platelets: 307 10*3/uL (ref 150–400)
RBC: 4.48 MIL/uL (ref 3.87–5.11)
RDW: 12.3 % (ref 11.5–15.5)
WBC: 7.5 10*3/uL (ref 4.0–10.5)
nRBC: 0 % (ref 0.0–0.2)

## 2022-01-07 LAB — URINALYSIS, ROUTINE W REFLEX MICROSCOPIC
Bilirubin Urine: NEGATIVE
Glucose, UA: NEGATIVE mg/dL
Ketones, ur: NEGATIVE mg/dL
Leukocytes,Ua: NEGATIVE
Nitrite: NEGATIVE
Protein, ur: NEGATIVE mg/dL
Specific Gravity, Urine: 1.023 (ref 1.005–1.030)
pH: 5.5 (ref 5.0–8.0)

## 2022-01-07 LAB — PREGNANCY, URINE: Preg Test, Ur: NEGATIVE

## 2022-01-07 LAB — LIPASE, BLOOD: Lipase: 24 U/L (ref 11–51)

## 2022-01-07 MED ORDER — ONDANSETRON 4 MG PO TBDP: 4.0000 mg | ORAL_TABLET | Freq: Three times a day (TID) | ORAL | 0 refills | Status: AC | PRN

## 2022-01-07 NOTE — Discharge Instructions (Addendum)
You were seen in the emergency department for low back pain. Based on current labs and CT scan, there is no evidence of appendicitis or a kidney stone. Continue to manage symptoms as needed with Tylenol/Ibuprofen. If your pain worsens, begin to experience a fever, or have notable urinary changes, please return to the ER or see your primary care provider for further evaluation.

## 2022-01-07 NOTE — ED Triage Notes (Signed)
Patient here POV from [facility].  Endorses Congestion that began 1 Week ago. Yesterday the Patient began to have ABD Pain to Mid and RLQ.   Sent by Clinic for Imaging. Some N/V. No Diarrhea. No Known Fevers.  NAD Noted during Triage. A&Ox4. CGS 15. Ambulatory.

## 2022-01-07 NOTE — ED Provider Notes (Signed)
[facility] EMERGENCY DEPT Provider Note   CSN: [id number] Arrival date & time: 01/07/22  1434     History Chief Complaint  Patient presents with   Abdominal Pain    Susan Brennan is a 26 y.o. female.   Abdominal Pain Associated symptoms: no chills, no dysuria, no fatigue, no fever, no hematuria and no vaginal bleeding   Patient presents to the ER with complaints of RLQ pain. She reports that pain began approximately one week ago and has not changed significantly. Has had some nausea and vomiting, but not consistently during the past week. Denies diarrhea, fevers, shortness of breath, chest pain, or leg swelling. Patient was advised by UC to come into ER for concerns of possible appendicitis.    Home Medications Prior to Admission medications   Medication Sig Start Date End Date Taking? Authorizing Provider  ondansetron (ZOFRAN-ODT) 4 MG disintegrating tablet Take 1 tablet (4 mg total) by mouth every 8 (eight) hours as needed for nausea. 01/07/22  Yes Smitty Knudsen, PA-C  diphenhydramine-acetaminophen (TYLENOL PM) 25-500 MG TABS tablet Take 2 tablets by mouth at bedtime.    [provider]  ibuprofen (ADVIL) 200 MG tablet Take 800 mg by mouth every 6 (six) hours as needed for moderate pain.    [provider]  loratadine-pseudoephedrine (CLARITIN-D 12-HOUR) 5-120 MG tablet Take 1 tablet by mouth daily as needed for allergies.    [provider]  Melatonin 10 MG TABS Take 30 mg by mouth at bedtime. Gummies     [provider]  nystatin (MYCOSTATIN) 100000 UNIT/ML suspension Take 4 mLs by mouth 4 (four) times daily as needed (thrush).  11/19/19   [provider]  promethazine (PHENERGAN) 25 MG tablet Take 1 tablet (25 mg total) by mouth every 6 (six) hours as needed for nausea or vomiting. 01/03/20   Rhys Martini, PA-C      Allergies    Iodine, Codeine, Tramadol, and Contrast media [iodinated contrast media]     Review of Systems   Review of Systems  Constitutional:  Negative for chills, fatigue and fever.  Gastrointestinal:  Positive for abdominal pain.  Genitourinary:  Positive for flank pain. Negative for dysuria, enuresis, hematuria and vaginal bleeding.  All other systems reviewed and are negative.   Physical Exam Updated Vital Signs BP 124/79 (BP Location: Right Arm)   Pulse (!) 56   Temp 98.2 F (36.8 C) (Oral)   Resp 16   Ht 5\' 8"  (1.727 m)   Wt (!) 157.9 kg   SpO2 100%   BMI 52.93 kg/m  Physical Exam Vitals and nursing note reviewed.  Constitutional:      General: She is not in acute distress.    Appearance: She is well-developed and normal weight. She is not ill-appearing.  HENT:     Head: Normocephalic and atraumatic.  Cardiovascular:     Rate and Rhythm: Normal rate and regular rhythm.     Heart sounds: Normal heart sounds.  Pulmonary:     Effort: Pulmonary effort is normal. No respiratory distress.     Breath sounds: Normal breath sounds. No wheezing.  Abdominal:     General: Bowel sounds are normal. There is no distension or abdominal bruit. There are no signs of injury.     Palpations: Abdomen is soft. There is no shifting dullness, hepatomegaly or mass.     Tenderness: There is abdominal tenderness in the right lower quadrant and epigastric area. There is right CVA tenderness  and guarding. There is no left CVA tenderness or rebound.  Skin:    General: Skin is warm and dry.     Capillary Refill: Capillary refill takes less than 2 seconds.  Neurological:     General: No focal deficit present.     Mental Status: She is alert.     ED Results / Procedures / Treatments   Labs (all labs ordered are listed, but only abnormal results are displayed) Labs Reviewed  COMPREHENSIVE METABOLIC PANEL - Abnormal; Notable for the following components:      Result Value   Creatinine, Ser 1.01 (*)    All other components within normal limits  URINALYSIS, ROUTINE W REFLEX  MICROSCOPIC - Abnormal; Notable for the following components:   Hgb urine dipstick MODERATE (*)    All other components within normal limits  RESP PANEL BY RT-PCR (RSV, FLU A&B, COVID)  RVPGX2  LIPASE, BLOOD  CBC  PREGNANCY, URINE    EKG None  Radiology CT Renal Stone Study  Result Date: 01/07/2022 CLINICAL DATA:  Abdominal and flank pain.  Some nausea and vomiting. EXAM: CT ABDOMEN AND PELVIS WITHOUT CONTRAST TECHNIQUE: Multidetector CT imaging of the abdomen and pelvis was performed following the standard protocol without IV contrast. RADIATION DOSE REDUCTION: This exam was performed according to the departmental dose-optimization program which includes automated exposure control, adjustment of the mA and/or kV according to patient size and/or use of iterative reconstruction technique. COMPARISON:  03/25/2021 FINDINGS: Lower chest: Chest unremarkable. Hepatobiliary: Geographic fatty deposition noted in the liver parenchyma. Nonvisualization of the gallbladder suggest prior cholecystectomy. No intrahepatic or extrahepatic biliary dilation. Pancreas: No focal mass lesion. No dilatation of the main duct. No intraparenchymal cyst. No peripancreatic edema. Spleen: No splenomegaly. No focal mass lesion. Adrenals/Urinary Tract: No adrenal nodule or mass. Kidneys unremarkable. No evidence for hydroureter. The urinary bladder appears normal for the degree of distention. Stomach/Bowel: Stomach is unremarkable. No gastric wall thickening. No evidence of outlet obstruction. Duodenum is normally positioned as is the ligament of Treitz. No small bowel wall thickening. No small bowel dilatation. The terminal ileum is normal. The appendix is normal. No gross colonic mass. No colonic wall thickening. Vascular/Lymphatic: No abdominal aortic aneurysm. No abdominal lymphadenopathy. No pelvic sidewall lymphadenopathy. Reproductive: Unremarkable. Other: No intraperitoneal free fluid. Musculoskeletal: No worrisome lytic  or sclerotic osseous abnormality. IMPRESSION: 1. No acute findings in the abdomen or pelvis. Specifically, no findings to explain the patient's history of abdominal pain. 2. Geographic fatty deposition in the liver parenchyma. Electronically Signed   By: Kennith Center M.D.   On: 01/07/2022 17:02    Procedures Procedures   Medications Ordered in ED Medications - No data to display  ED Course/ Medical Decision Making/ A&P                           Medical Decision Making Amount and/or Complexity of Data Reviewed Labs: ordered.   This patient presents to the ED for concern of RLQ pain. Differential diagnosis includes appendicitis, ovarian torsion, renal stone, pyelonephritis, UTI   Lab Tests:  I Ordered, and personally interpreted labs.  The pertinent results include:  Hematuria on UA   Imaging Studies ordered:  I ordered imaging studies including CT renal stone study  I independently visualized and interpreted imaging which showed no evidence of stone, hydronephrosis, or other abdominal abnormality I agree with the radiologist interpretation   Medicines ordered and prescription drug management:  I have reviewed  the patients home medicines and have made adjustments as needed   Problem List / ED Course:  Paitent presented to the ER with concerns of RLQ pain. Patient was seen in the UC and was advised to go to ER due to concerns for possible appendicitis. On exam, pain was also present in right CVA so renal stone study was ordered to r/o possible nephrolithiasis as well as appendicitis. Given reassuring labs and negative CT stone study, advised patient that she is safe to discharge with what is most likely acute low back pain. Advised her to follow up with her PCP if symptoms persist or return to the ER if the pain significantly worsens. Patient was agreeable to this plan and verbalized understanding all return precautions.   Final Clinical Impression(s) / ED Diagnoses Final  diagnoses:  Acute right-sided low back pain without sciatica    Rx / DC Orders ED Discharge Orders          Ordered    ondansetron (ZOFRAN-ODT) 4 MG disintegrating tablet  Every 8 hours PRN        01/07/22 1734              Smitty Knudsen, PA-C 01/07/22 1736    Elayne Snare K, DO 01/08/22 0011

## 2022-01-07 NOTE — ED Notes (Signed)
Discharge paperwork given and verbally understood. 

## 2022-08-04 ENCOUNTER — Ambulatory Visit (HOSPITAL_BASED_OUTPATIENT_CLINIC_OR_DEPARTMENT_OTHER)
Admission: RE | Admit: 2022-08-04 | Discharge: 2022-08-04 | Disposition: A | Discharge status: Home / Self Care | Payer: Managed Care, Other (non HMO) | Source: Ambulatory Visit | Attending: Physician Assistant | Admitting: Physician Assistant

## 2022-08-04 ENCOUNTER — Other Ambulatory Visit (HOSPITAL_BASED_OUTPATIENT_CLINIC_OR_DEPARTMENT_OTHER): Payer: Self-pay | Admitting: Physician Assistant

## 2022-08-04 DIAGNOSIS — M79605 Pain in left leg: Secondary | ICD-10-CM | POA: Insufficient documentation

## 2022-08-19 ENCOUNTER — Other Ambulatory Visit: Payer: Self-pay | Admitting: Orthopedic Surgery

## 2022-08-19 DIAGNOSIS — R319 Hematuria, unspecified: Secondary | ICD-10-CM

## 2022-08-30 ENCOUNTER — Other Ambulatory Visit: Payer: Managed Care, Other (non HMO)

## 2022-09-01 ENCOUNTER — Other Ambulatory Visit: Payer: Managed Care, Other (non HMO)

## 2022-09-14 ENCOUNTER — Ambulatory Visit
Admission: RE | Admit: 2022-09-14 | Discharge: 2022-09-14 | Disposition: A | Discharge status: Home / Self Care | Payer: Managed Care, Other (non HMO) | Source: Ambulatory Visit | Attending: Orthopedic Surgery | Admitting: Orthopedic Surgery

## 2022-09-14 DIAGNOSIS — R319 Hematuria, unspecified: Secondary | ICD-10-CM

## 2022-12-05 IMAGING — CT CT ABD-PELV W/O CM
2 of 4 series · 12 of 46 positions shown, 14 images · non-contrast
Comparison: CT examination dated March 25, 2020

CLINICAL DATA: Hematuria, pain on the right, history of stones.



[Series 2: renal stone 5.00 br40 s3 axial · axial · 0.69mm/px · z∈[+1171,+1651]mm · 9 of 116 slices shown, 11 images]
[im 10/116  soft-tissue]
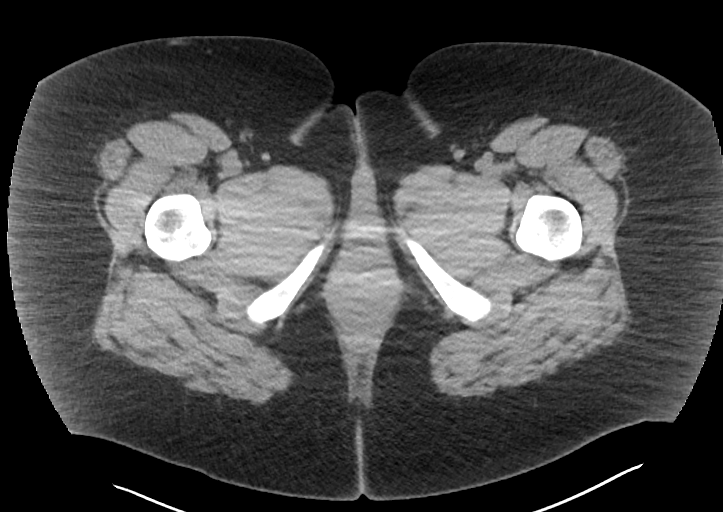
[im 10/116  bone]
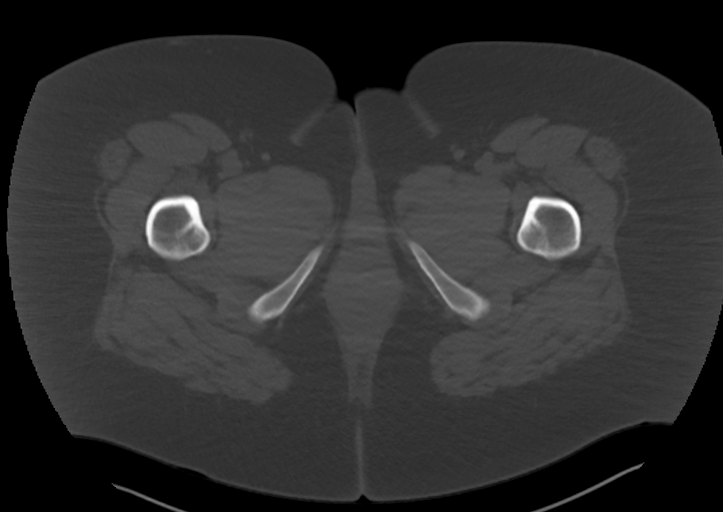
[im 24/116  soft-tissue]
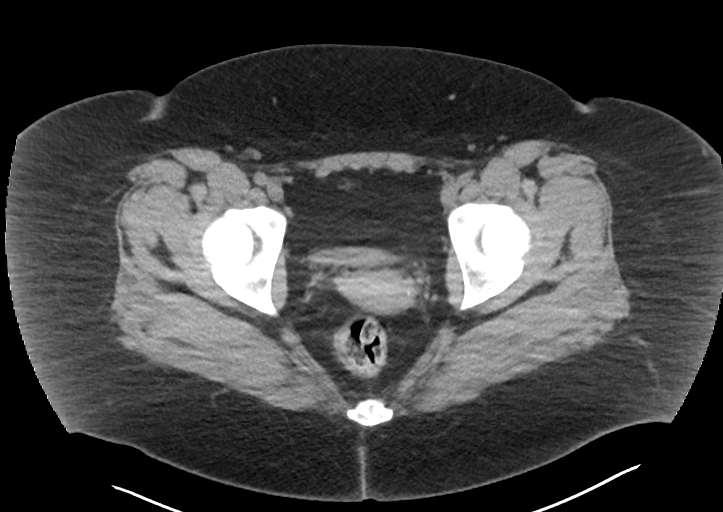
[im 33/116  soft-tissue]
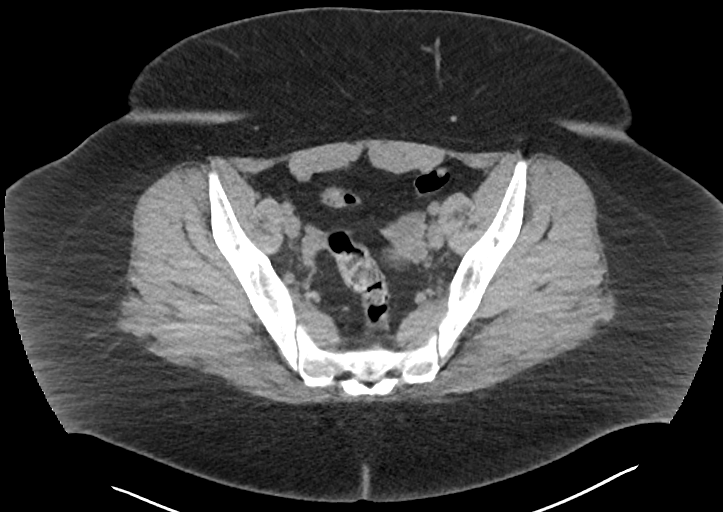
[im 47/116  soft-tissue]
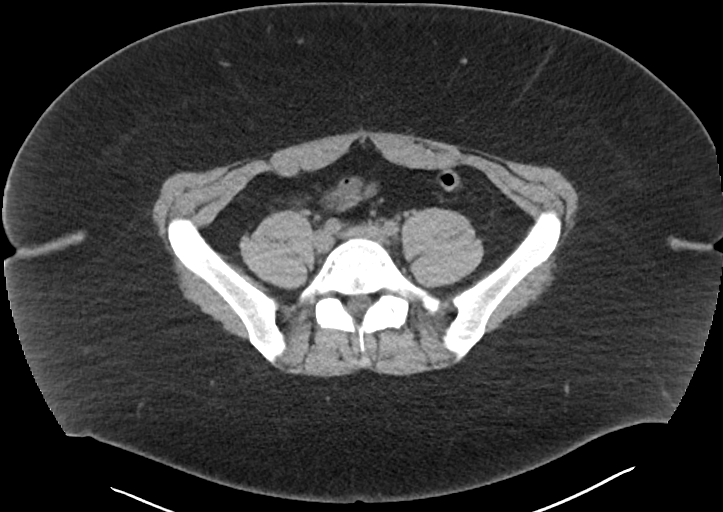
[im 60/116  soft-tissue]
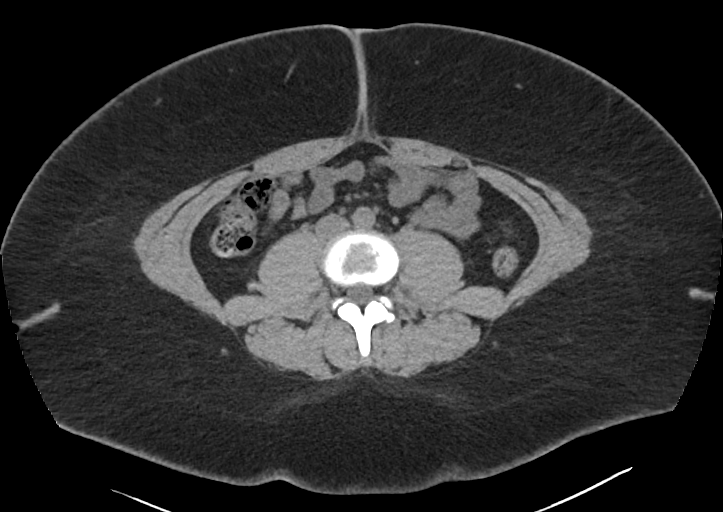
[im 70/116  soft-tissue]
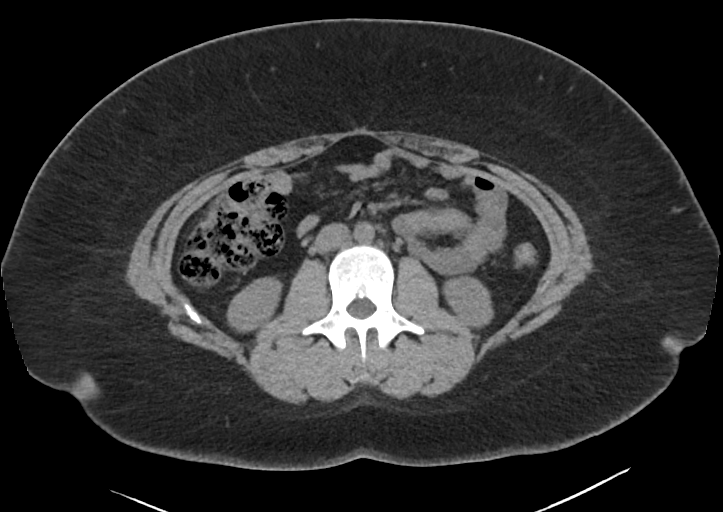
[im 83/116  soft-tissue]
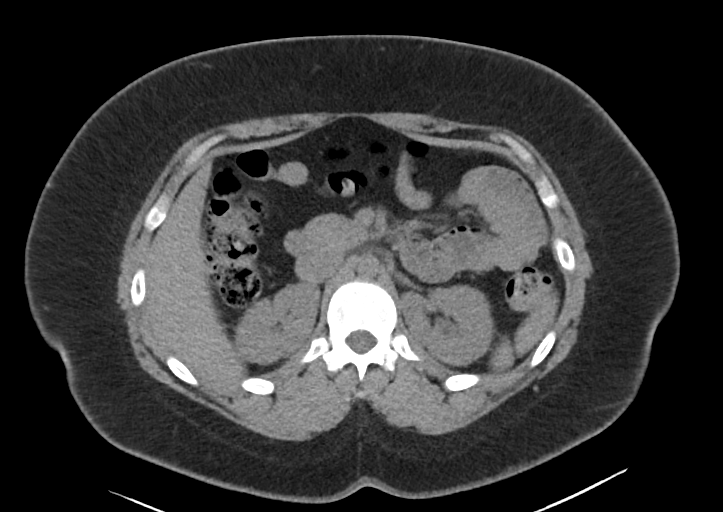
[im 97/116  soft-tissue]
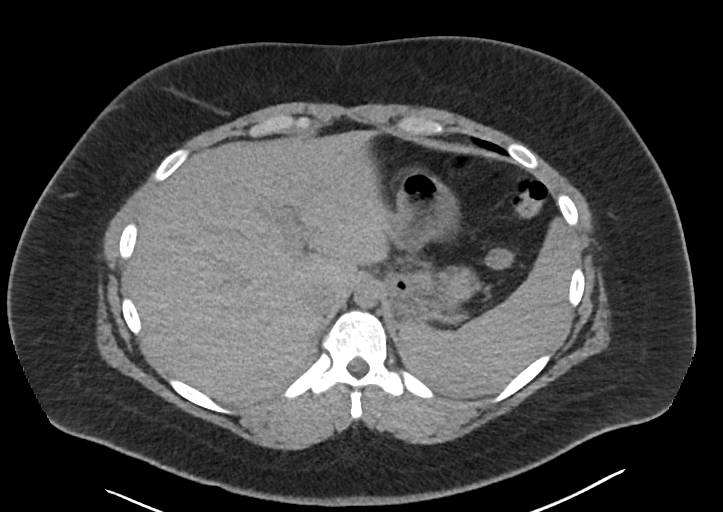
[im 106/116  soft-tissue]
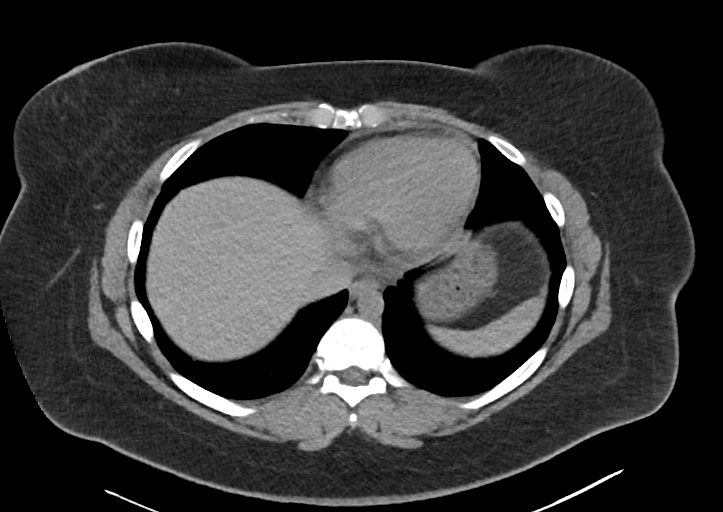
[im 106/116  bone]
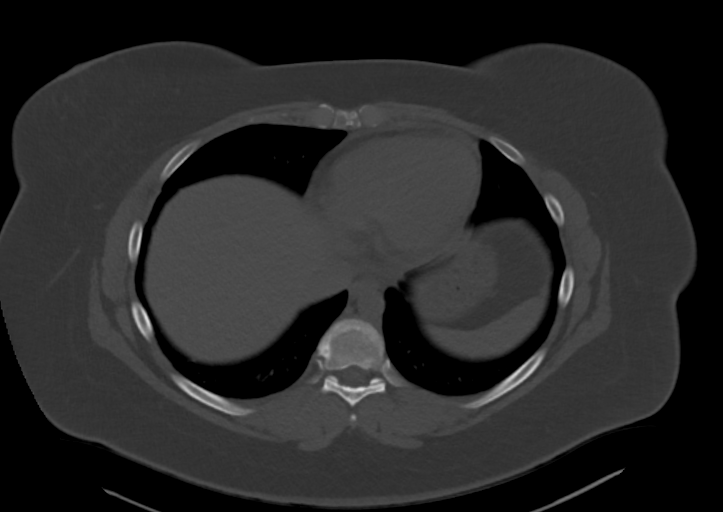

[Series 6: renal stone 2.00 br40 s3 cor · coronal · 0.98mm/px · 3 of 176 slices shown]
[im 59/176  soft-tissue]
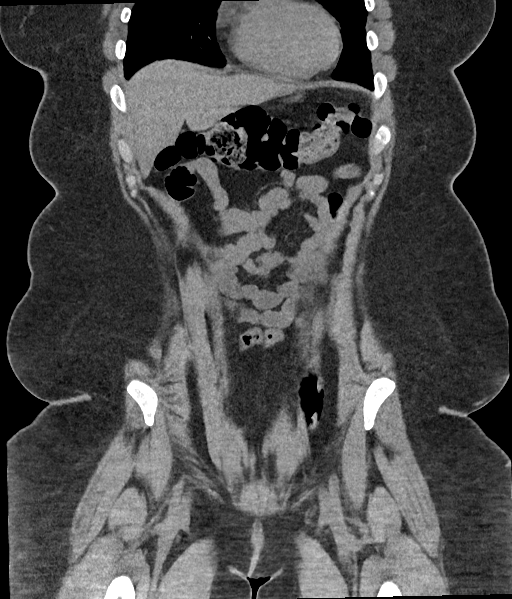
[im 78/176  soft-tissue]
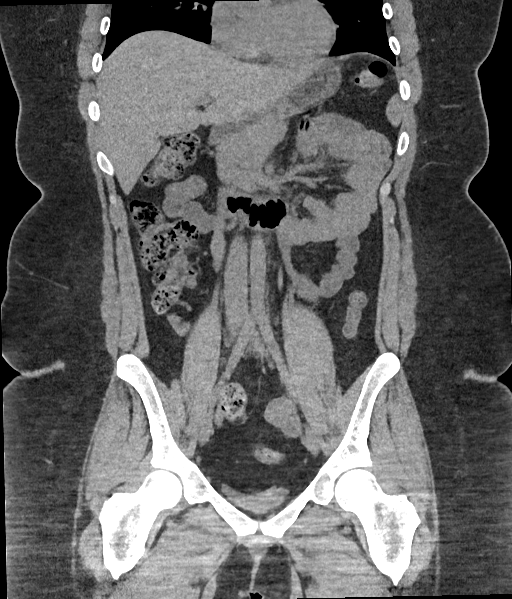
[im 98/176  soft-tissue]
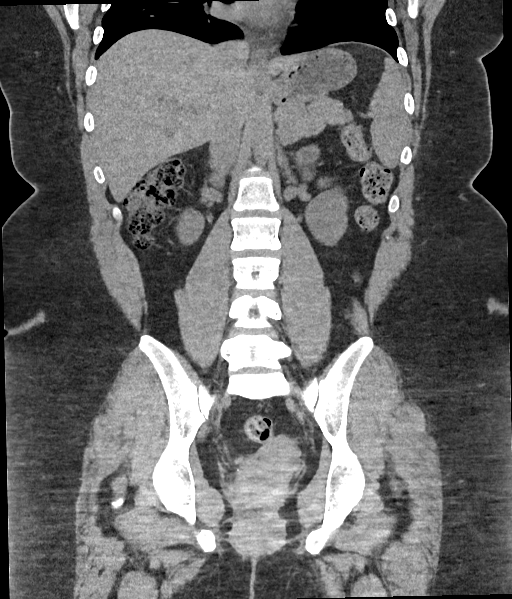

[12 of 46 positions shown; findings below may reference images not displayed]

FINDINGS: Lower chest: No acute abnormality.

Hepatobiliary: No focal liver abnormality is seen. Status post
cholecystectomy. No biliary dilatation.

Pancreas: Unremarkable. No pancreatic ductal dilatation or
surrounding inflammatory changes.

Spleen: Spleen is mildly enlarged measuring approximately 14.6 cm in
maximal oblique dimension, unchanged.

Adrenals/Urinary Tract: Adrenal glands are unremarkable. Kidneys are
normal, without renal calculi, focal lesion, or hydronephrosis.
Bladder is unremarkable.

Stomach/Bowel: Stomach is within normal limits. Appendix appears
normal. No evidence of bowel wall thickening, distention, or
inflammatory changes.

Vascular/Lymphatic: No significant vascular findings are present. No
enlarged abdominal or pelvic lymph nodes.

Reproductive: Uterus and bilateral adnexa are unremarkable.

Other: No abdominal wall hernia or abnormality. No abdominopelvic
ascites.

Musculoskeletal: No acute or significant osseous findings.
IMPRESSION: 1.  No CT evidence of acute abdominal/pelvic process.

2.  No evidence of nephrolithiasis or hydronephrosis.

3.  Mildly enlarged spleen measuring up to 14.6 cm, unchanged.

## 2023-10-25 ENCOUNTER — Emergency Department (HOSPITAL_BASED_OUTPATIENT_CLINIC_OR_DEPARTMENT_OTHER)
Admission: EM | Admit: 2023-10-25 | Discharge: 2023-10-25 | Disposition: A | Discharge status: Home / Self Care | Attending: Emergency Medicine | Admitting: Emergency Medicine

## 2023-10-25 DIAGNOSIS — R1013 Epigastric pain: Secondary | ICD-10-CM | POA: Insufficient documentation

## 2023-10-25 LAB — COMPREHENSIVE METABOLIC PANEL WITH GFR
ALT: 24 U/L (ref 0–44)
AST: 24 U/L (ref 15–41)
Albumin: 3.9 g/dL (ref 3.5–5.0)
Alkaline Phosphatase: 67 U/L (ref 38–126)
Anion gap: 9 (ref 5–15)
BUN: 9 mg/dL (ref 6–20)
CO2: 26 mmol/L (ref 22–32)
Calcium: 9.3 mg/dL (ref 8.9–10.3)
Chloride: 105 mmol/L (ref 98–111)
Creatinine, Ser: 0.82 mg/dL (ref 0.44–1.00)
GFR, Estimated: >60 mL/min (ref >60)
Glucose, Bld: 110 mg/dL — ABNORMAL HIGH (ref 70–99)
Potassium: 4 mmol/L (ref 3.5–5.1)
Sodium: 140 mmol/L (ref 135–145)
Total Bilirubin: 0.5 mg/dL (ref 0.0–1.2)
Total Protein: 6.9 g/dL (ref 6.5–8.1)

## 2023-10-25 LAB — CBC
HCT: 41.7 % (ref 36.0–46.0)
Hemoglobin: 14.2 g/dL (ref 12.0–15.0)
MCH: 32.7 pg (ref 26.0–34.0)
MCHC: 34.1 g/dL (ref 30.0–36.0)
MCV: 96.1 fL (ref 80.0–100.0)
Platelets: 243 K/uL (ref 150–400)
RBC: 4.34 MIL/uL (ref 3.87–5.11)
RDW: 12.2 % (ref 11.5–15.5)
WBC: 6 K/uL (ref 4.0–10.5)
nRBC: 0 % (ref 0.0–0.2)

## 2023-10-25 LAB — URINALYSIS, ROUTINE W REFLEX MICROSCOPIC
Bilirubin Urine: NEGATIVE
Glucose, UA: NEGATIVE mg/dL
Hgb urine dipstick: NEGATIVE
Ketones, ur: NEGATIVE mg/dL
Leukocytes,Ua: NEGATIVE
Nitrite: NEGATIVE
Protein, ur: NEGATIVE mg/dL
Specific Gravity, Urine: 1.017 (ref 1.005–1.030)
pH: 6 (ref 5.0–8.0)

## 2023-10-25 LAB — LIPASE, BLOOD: Lipase: 26 U/L (ref 11–51)

## 2023-10-25 LAB — PREGNANCY, URINE: Preg Test, Ur: NEGATIVE

## 2023-10-25 LAB — TSH: TSH: 2.2 u[IU]/mL (ref 0.350–4.500)

## 2023-10-25 LAB — D-DIMER, QUANTITATIVE: D-Dimer, Quant: <0.27 ug{FEU}/mL (ref 0.00–0.50)

## 2023-10-25 MED ORDER — ONDANSETRON HCL 4 MG/2ML IJ SOLN
4.0000 mg | Freq: Once | INTRAMUSCULAR | Status: AC
  Administered 2023-10-25: 4 mg via INTRAVENOUS
  Filled 2023-10-25: qty 2

## 2023-10-25 MED ORDER — ALUM & MAG HYDROXIDE-SIMETH 200-200-20 MG/5ML PO SUSP
30.0000 mL | Freq: Once | ORAL | Status: AC
  Administered 2023-10-25: 30 mL via ORAL
  Filled 2023-10-25: qty 30

## 2023-10-25 MED ORDER — FAMOTIDINE 20 MG PO TABS: 20.0000 mg | ORAL_TABLET | Freq: Two times a day (BID) | ORAL | 0 refills | Status: AC

## 2023-10-25 MED ORDER — DICYCLOMINE HCL 10 MG PO CAPS
10.0000 mg | ORAL_CAPSULE | Freq: Once | ORAL | Status: AC
  Administered 2023-10-25: 10 mg via ORAL
  Filled 2023-10-25: qty 1

## 2023-10-25 MED ORDER — PANTOPRAZOLE SODIUM 40 MG IV SOLR
40.0000 mg | Freq: Once | INTRAVENOUS | Status: AC
  Administered 2023-10-25: 40 mg via INTRAVENOUS
  Filled 2023-10-25: qty 10

## 2023-10-25 NOTE — ED Triage Notes (Signed)
 Pt reports ULQ and epigastric intermittent pain for 3-4 weeks with nausea and some emesis.  Pt seen in UC 2 weeks ago for same, treated with Rx promethazine , omeprazole and hydroxyzine - pt reports no change in sx with Rx medications.  Pt denies fever at home or blood in emesis.

## 2023-10-25 NOTE — ED Notes (Signed)
 DC paperwork given and verbally understood.

## 2023-10-25 NOTE — ED Provider Notes (Signed)
 [place] AT [facility] Provider Note   CSN: [id number] Arrival date & time: 10/25/23  9040     Patient presents with: Abdominal Pain   Susan Brennan is a 28 y.o. female history of GERD, anxiety, prediabetes presents with complaints of epigastric abdominal pain.  Reports her symptoms have been ongoing for the past few weeks.  Was evaluated urgent care.  Was felt to be consistent with acid reflux was prescribed omeprazole and promethazine .  She has had no significant improvement of her symptoms.  She is unable to determine whether her symptoms are worse with eating or reclining at night.  Her symptoms are not exertional.  She does describe pain radiating to her chest and has had some shortness of breath as well.  No significant cough or URI symptoms.  No syncopal episodes.  No cardiac history or prior blood clots.  She is on birth control.  However no recent surgeries, travel or hospitalizations.  Does not drink alcohol regularly.  Does have a history of cholecystectomy.    Abdominal Pain     Past Medical History:  Diagnosis Date   Anxiety    Depression    GERD (gastroesophageal reflux disease)    HA (headache)    Morbid obesity (HCC)    Prediabetes    Past Surgical History:  Procedure Laterality Date   ANTERIOR CRUCIATE LIGAMENT REPAIR Right 11/30/2019   Procedure: RECONSTRUCTION ANTERIOR CRUCIATE LIGAMENT (ACL) WITH HAMSTRING GRAFT;  Surgeon: Yvone Rush, MD;  Location: [facility];  Service: Orthopedics;  Laterality: Right;   CHOLECYSTECTOMY N/A 04/12/2018   Procedure: LAPAROSCOPIC CHOLECYSTECTOMY WITH INTRAOPERATIVE CHOLANGIOGRAM;  Surgeon: Stevie Herlene Righter, MD;  Location: [facility];  Service: General;  Laterality: N/A;     Prior to Admission medications   Medication Sig Start Date End Date Taking? Authorizing Provider  famotidine (PEPCID) 20 MG tablet Take 1 tablet (20 mg total) by mouth 2 (two) times daily. 10/25/23  Yes Donnajean Lynwood DEL,  PA-C  diphenhydramine -acetaminophen  (TYLENOL  PM) 25-500 MG TABS tablet Take 2 tablets by mouth at bedtime.    [provider]  ibuprofen  (ADVIL ) 200 MG tablet Take 800 mg by mouth every 6 (six) hours as needed for moderate pain.    [provider]  loratadine-pseudoephedrine (CLARITIN-D 12-HOUR) 5-120 MG tablet Take 1 tablet by mouth daily as needed for allergies.    [provider]  Melatonin 10 MG TABS Take 30 mg by mouth at bedtime. Gummies     [provider]  nystatin (MYCOSTATIN) 100000 UNIT/ML suspension Take 4 mLs by mouth 4 (four) times daily as needed (thrush).  11/19/19   [provider]  ondansetron  (ZOFRAN -ODT) 4 MG disintegrating tablet Take 1 tablet (4 mg total) by mouth every 8 (eight) hours as needed for nausea. 01/07/22   Zelaya, Oscar A, PA-C  promethazine  (PHENERGAN ) 25 MG tablet Take 1 tablet (25 mg total) by mouth every 6 (six) hours as needed for nausea or vomiting. 01/03/20   Graham, Laura E, PA-C    Allergies: Iodine, Codeine, Tramadol , and Contrast media [iodinated contrast media]    Review of Systems  Gastrointestinal:  Positive for abdominal pain.    Updated Vital Signs BP 108/67   Pulse 77   Temp 98.1 F (36.7 C) (Oral)   Resp (!) 22   LMP 09/01/2023 (Approximate) Comment: irregular periods w/implant  SpO2 99%   Physical Exam Vitals and nursing note reviewed.  Constitutional:      General: She is not  in acute distress.    Appearance: She is well-developed.  HENT:     Head: Normocephalic and atraumatic.  Eyes:     Conjunctiva/sclera: Conjunctivae normal.  Cardiovascular:     Rate and Rhythm: Normal rate and regular rhythm.     Heart sounds: No murmur heard. Pulmonary:     Effort: Pulmonary effort is normal. No respiratory distress.     Breath sounds: Normal breath sounds.  Abdominal:     Palpations: Abdomen is soft.     Tenderness: There is abdominal tenderness.     Comments: Mild epigastric abdominal  tenderness.  Abdomen soft and nondistended.  Musculoskeletal:        General: No swelling.     Cervical back: Neck supple.  Skin:    General: Skin is warm and dry.     Capillary Refill: Capillary refill takes less than 2 seconds.  Neurological:     Mental Status: She is alert.  Psychiatric:        Mood and Affect: Mood normal.     (all labs ordered are listed, but only abnormal results are displayed) Labs Reviewed  COMPREHENSIVE METABOLIC PANEL WITH GFR - Abnormal; Notable for the following components:      Result Value   Glucose, Bld 110 (*)    All other components within normal limits  URINALYSIS, ROUTINE W REFLEX MICROSCOPIC - Abnormal; Notable for the following components:   APPearance HAZY (*)    Bacteria, UA RARE (*)    All other components within normal limits  LIPASE, BLOOD  CBC  PREGNANCY, URINE  D-DIMER, QUANTITATIVE  TSH    EKG: EKG Interpretation Date/Time:  Sunday October 25 2023 10:45:07 EDT Ventricular Rate:  82 PR Interval:  162 QRS Duration:  100 QT Interval:  360 QTC Calculation: 421 R Axis:   43  Text Interpretation: Sinus rhythm Ventricular premature complex Low voltage, precordial leads No significant change since last tracing Confirmed by Roselyn Dunnings [phone]) on 10/25/2023 11:06:40 AM  Radiology: No results found.   Procedures   Medications Ordered in the ED  alum & mag hydroxide-simeth (MAALOX/MYLANTA) 200-200-20 MG/5ML suspension 30 mL (30 mLs Oral Given 10/25/23 1049)  dicyclomine  (BENTYL ) capsule 10 mg (10 mg Oral Given 10/25/23 1052)  pantoprazole (PROTONIX) injection 40 mg (40 mg Intravenous Given 10/25/23 1051)  ondansetron  (ZOFRAN ) injection 4 mg (4 mg Intravenous Given 10/25/23 1050)    Clinical Course as of 10/25/23 1255  Sun Oct 25, 2023  1108 Patient with history of GERD evaluated for complaints of epigastric abdominal pain that radiates to her chest is associated with nausea, vomiting and shortness of breath.  She is  hemodynamically stable.  On exam she has tenderness to her epigastric region.  Was evaluated urgent care few weeks ago and was felt to be consistent with acid reflux.  Will obtain routine abdominal labs.  Will additionally obtain EKG and dimer given the associated chest pain and shortness of breath with current birth control use. [JT]  1109 CBC Unremarkable [JT]  1110 Urinalysis, Routine w reflex microscopic -Urine, Clean Catch(!) Rare bacteria with 0-5 WBCs, negative nitrites, negative leukocytes [JT]  1110 D-dimer, quantitative Negative [JT]  1127 Comprehensive metabolic panel(!) No significant abnormality [JT]  1127 Lipase, blood Within normal limits [JT]  1133 EKG 12-Lead Sinus rhythm without ischemic changes [JT]  1221 Patient reported improvement of symptoms.  Will p.o. challenge. [JT]  1251 Successful p.o. challenge.  Overall workup reassuring.  Likely etiology most consistent with GERD/PUD.  Patient  will continue omeprazole, given persistent symptoms we will send a prescription for famotidine.  Will provide GI follow-up.  Patient is additionally requesting we check her thyroid today.  Reports that she has been adjusting her medications and has not been able to follow-up with her PCP.  Reports significant fatigue and increased anxiety recently.  Will add on TSH for outpatient management and provide PCP follow-up.  Patient is understanding agreement plan.  Strict return precautions provided. [JT]    Clinical Course User Index [JT] Donnajean Lynwood DEL, PA-C                                 Medical Decision Making Amount and/or Complexity of Data Reviewed Labs: ordered.   This patient presents to the ED with chief complaint(s) of abdominal pain.  The complaint involves an extensive differential diagnosis and also carries with it a high risk of complications and morbidity.   Pertinent past medical history as listed in HPI  The differential diagnosis includes  Considered appendicitis,  however patient has no tenderness to McBurney's point, no leukocytosis and is afebrile.  She has a history of cholecystectomy.  Her lipase is within normal limits.  Her dimer is within normal limits.  Do not suspect PE.  Lung sounds are clear.  Do not suspect pneumonia.  Additional history obtained: Records reviewed Care Everywhere/External Records  Disposition:   Patient will be discharged home. The patient has been appropriately medically screened and/or stabilized in the ED. I have low suspicion for any other emergent medical condition which would require further screening, evaluation or treatment in the ED or require inpatient management. At time of discharge the patient is hemodynamically stable and in no acute distress. I have discussed work-up results and diagnosis with patient and answered all questions. Patient is agreeable with discharge plan. We discussed strict return precautions for returning to the emergency department and they verbalized understanding.     Social Determinants of Health:   none  This note was dictated with voice recognition software.  Despite best efforts at proofreading, errors may have occurred which can change the documentation meaning.       Final diagnoses:  Epigastric pain    ED Discharge Orders          Ordered    famotidine (PEPCID) 20 MG tablet  2 times daily        10/25/23 1254               Donnajean Lynwood DEL DEVONNA 10/25/23 1255    Roselyn Carlin NOVAK, MD 10/25/23 1429

## 2023-10-25 NOTE — ED Notes (Signed)
 Patient called in and wanted to know if Doctor had given a note to be out of work.  Requested not until Wednesday so she could see her GI.  Reviewed with Nurse and no note needed.  Advised patient and they were fine.

## 2023-10-25 NOTE — Discharge Instructions (Addendum)
 You were evaluated emergency room for abdominal pain.  Your lab work did not show any significant abnormality.  Your symptoms most consistent with acid reflux.  Prescription for famotidine was sent into your pharmacy.  You may take this in addition to your omeprazole.  Please call the number on the sheet to follow-up with your GI doctor.  Additionally please call the other number to set up with a primary care doctor.  If you experience any new or worsening symptoms please return to emergency room.

## 2023-10-25 NOTE — ED Notes (Signed)
 Spoke with lab, will run TSH

## 2023-10-31 ENCOUNTER — Encounter: Payer: Self-pay | Admitting: Emergency Medicine

## 2023-10-31 ENCOUNTER — Ambulatory Visit
Admission: EM | Admit: 2023-10-31 | Discharge: 2023-10-31 | Disposition: A | Discharge status: Home / Self Care | Attending: Emergency Medicine | Admitting: Emergency Medicine

## 2023-10-31 DIAGNOSIS — R519 Headache, unspecified: Secondary | ICD-10-CM

## 2023-10-31 DIAGNOSIS — R42 Dizziness and giddiness: Secondary | ICD-10-CM

## 2023-10-31 LAB — GLUCOSE, POCT (MANUAL RESULT ENTRY): POCT Glucose (KUC): 106 mg/dL — AB (ref 70–99)

## 2023-10-31 MED ORDER — CETIRIZINE HCL 10 MG PO TABS: 10.0000 mg | ORAL_TABLET | Freq: Every day | ORAL | 0 refills | Status: AC

## 2023-10-31 MED ORDER — MECLIZINE HCL 25 MG PO TABS: 25.0000 mg | ORAL_TABLET | Freq: Three times a day (TID) | ORAL | 0 refills | Status: AC | PRN

## 2023-10-31 NOTE — ED Provider Notes (Signed)
 [facility]    CSN: [id number] Arrival date & time: 10/31/23  1117      History   Chief Complaint Chief Complaint  Patient presents with   Dizziness   Headache    HPI Susan Brennan is a 28 y.o. female.   Patient presents to clinic over concern of ongoing dizziness and headache for the past month.   Describes the dizziness as being 'drunk' and having brain fog.  Dizziness spells will trigger headache, nausea and vomiting.  Sometimes if they are severe they can trigger panic attack.  Reports the panic attacks have been treated as well as the nausea.  Does have a history of migraines, reports this headache is different.  Migraines are usually posterior and this headache is generalized and sometimes in her forehead.  Without vomiting.  Patient will get 'swimmy headed' intermittently with position changes and more consistently with riding in the car.  Has tried Tylenol  for headache.  Reports she has not really been able to take Celebrex for back pain due to her stomach ulcer.  Mother has a history of vertigo.  The history is provided by the patient.  Dizziness Headache   Past Medical History:  Diagnosis Date   Anxiety    Depression    GERD (gastroesophageal reflux disease)    HA (headache)    Morbid obesity (HCC)    Prediabetes     Patient Active Problem List   Diagnosis Date Noted   S/P ACL reconstruction 11/30/2019   Complete tear of anterior cruciate ligament of right knee 11/30/2019   Cholecystitis with cholelithiasis 04/11/2018   Anxiety     Past Surgical History:  Procedure Laterality Date   ANTERIOR CRUCIATE LIGAMENT REPAIR Right 11/30/2019   Procedure: RECONSTRUCTION ANTERIOR CRUCIATE LIGAMENT (ACL) WITH HAMSTRING GRAFT;  Surgeon: Yvone Rush, MD;  Location: [facility];  Service: Orthopedics;  Laterality: Right;   CHOLECYSTECTOMY N/A 04/12/2018   Procedure: LAPAROSCOPIC CHOLECYSTECTOMY WITH INTRAOPERATIVE CHOLANGIOGRAM;  Surgeon: Stevie  Herlene Righter, MD;  Location: [facility];  Service: General;  Laterality: N/A;    OB History   No obstetric history on file.      Home Medications    Prior to Admission medications   Medication Sig Start Date End Date Taking? Authorizing Provider  cetirizine (ZYRTEC ALLERGY) 10 MG tablet Take 1 tablet (10 mg total) by mouth daily. 10/31/23  Yes Rmoni Keplinger  N, FNP  meclizine (ANTIVERT) 25 MG tablet Take 1 tablet (25 mg total) by mouth 3 (three) times daily as needed for dizziness. 10/31/23  Yes Marykatherine Sherwood  N, FNP  diphenhydramine -acetaminophen  (TYLENOL  PM) 25-500 MG TABS tablet Take 2 tablets by mouth at bedtime.    [provider]  famotidine (PEPCID) 20 MG tablet Take 1 tablet (20 mg total) by mouth 2 (two) times daily. 10/25/23   Donnajean Lynwood DEL, PA-C  ibuprofen  (ADVIL ) 200 MG tablet Take 800 mg by mouth every 6 (six) hours as needed for moderate pain.    [provider]  loratadine-pseudoephedrine (CLARITIN-D 12-HOUR) 5-120 MG tablet Take 1 tablet by mouth daily as needed for allergies.    [provider]  Melatonin 10 MG TABS Take 30 mg by mouth at bedtime. Gummies     [provider]  nystatin (MYCOSTATIN) 100000 UNIT/ML suspension Take 4 mLs by mouth 4 (four) times daily as needed (thrush).  11/19/19   [provider]  ondansetron  (ZOFRAN -ODT) 4 MG disintegrating tablet Take 1 tablet (4 mg total) by mouth every 8 (  eight) hours as needed for nausea. 01/07/22   Zelaya, Oscar A, PA-C  promethazine  (PHENERGAN ) 25 MG tablet Take 1 tablet (25 mg total) by mouth every 6 (six) hours as needed for nausea or vomiting. 01/03/20   Arlyss Leita BRAVO, PA-C    Family History Family History  Problem Relation Age of Onset   Irritable bowel syndrome Mother    Diabetes Father    Heart disease Father    Atrial fibrillation Father    Colon cancer Neg Hx    Stomach cancer Neg Hx    Esophageal cancer Neg Hx    Pancreatic cancer Neg Hx     Social  History Social History   Tobacco Use   Smoking status: Never   Smokeless tobacco: Never  Vaping Use   Vaping status: Every Day  Substance Use Topics   Alcohol use: Not Currently    Comment: Occ    Drug use: Yes    Frequency: 7.0 times per week    Types: Marijuana    Comment: 1 every 2 weeks     Allergies   Iodine, Codeine, Tramadol , and Contrast media [iodinated contrast media]   Review of Systems Review of Systems  Per HPI  Physical Exam Triage Vital Signs ED Triage Vitals  Encounter Vitals Group     BP 10/31/23 1133 111/73     Girls Systolic BP Percentile --      Girls Diastolic BP Percentile --      Boys Systolic BP Percentile --      Boys Diastolic BP Percentile --      Pulse Rate 10/31/23 1133 90     Resp 10/31/23 1133 16     Temp 10/31/23 1133 98.1 F (36.7 C)     Temp Source 10/31/23 1133 Oral     SpO2 10/31/23 1133 98 %     Weight --      Height --      Head Circumference --      Peak Flow --      Pain Score 10/31/23 1134 0     Pain Loc --      Pain Education --      Exclude from Growth Chart --    No data found.  Updated Vital Signs BP 118/79 (BP Location: Right Arm)   Pulse 99   Temp 98.1 F (36.7 C) (Oral)   Resp 16   LMP 10/29/2023 (Approximate) Comment: irregular periods w/implant  SpO2 98%   Visual Acuity Right Eye Distance:   Left Eye Distance:   Bilateral Distance:    Right Eye Near:   Left Eye Near:    Bilateral Near:     Physical Exam Vitals and nursing note reviewed.  Constitutional:      Appearance: Normal appearance. She is well-developed.  HENT:     Head: Normocephalic and atraumatic.     Right Ear: Tympanic membrane, ear canal and external ear normal.     Left Ear: Tympanic membrane, ear canal and external ear normal.     Nose: Nose normal.     Mouth/Throat:     Mouth: Mucous membranes are moist.  Eyes:     Extraocular Movements: Extraocular movements intact.     Pupils: Pupils are equal, round, and reactive to  light.  Cardiovascular:     Rate and Rhythm: Normal rate.  Pulmonary:     Effort: Pulmonary effort is normal. No respiratory distress.  Musculoskeletal:        General: Normal  range of motion.  Skin:    General: Skin is warm and dry.  Neurological:     General: No focal deficit present.     Mental Status: She is alert and oriented to person, place, and time.     GCS: GCS eye subscore is 4. GCS verbal subscore is 5. GCS motor subscore is 6.     Cranial Nerves: No cranial nerve deficit or facial asymmetry.  Psychiatric:        Mood and Affect: Mood normal.        Behavior: Behavior normal.      UC Treatments / Results  Labs (all labs ordered are listed, but only abnormal results are displayed) Labs Reviewed  GLUCOSE, POCT (MANUAL RESULT ENTRY) - Abnormal; Notable for the following components:      Result Value   POCT Glucose ([facility]) 106 (*)    All other components within normal limits    EKG   Radiology No results found.  Procedures Procedures (including critical care time)  Medications Ordered in UC Medications - No data to display  Initial Impression / Assessment and Plan / UC Course  I have reviewed the triage vital signs and the nursing notes.  Pertinent labs & imaging results that were available during my care of the patient were reviewed by me and considered in my medical decision making (see chart for details).  Vitals and triage reviewed, patient is hemodynamically stable.  Tympanic membranes are pearly gray bilaterally. PERRLA, EOMI without nystagmus.  Eye movement does trigger dizziness.  Negative for orthostatic hypotension.  CBG within normal limits.  Suspect vertigo may be causing dizziness and headache.  Symptomatic management with meclizine, encouraged Epley maneuver.  Plan of care, follow-up care and return precautions given, no questions at this time.     Final Clinical Impressions(s) / UC Diagnoses   Final diagnoses:  Dizziness  Intractable  headache, unspecified chronicity pattern, unspecified headache type     Discharge Instructions      Take the Zyrtec daily.  You can take the meclizine as needed for dizziness or feeling 'drunk.'  This medication may cause drowsiness or sedation as well as dry mouth.  Do not drink alcohol or drive on the meclizine. You can try the Epley maneuver at home as well, look up videos online for how to perform.   Avoid NSAIDs such as ibuprofen  with your stomach ulcers.  You can try Excedrin Migraine for your headache.  It is important that you follow-up with your primary care provider regarding ongoing management of your headaches and dizziness.  Return to clinic for new or urgent symptoms.      ED Prescriptions     Medication Sig Dispense Auth. Provider   meclizine (ANTIVERT) 25 MG tablet Take 1 tablet (25 mg total) by mouth 3 (three) times daily as needed for dizziness. 30 tablet Dreama, Tomoko Sandra  N, FNP   cetirizine (ZYRTEC ALLERGY) 10 MG tablet Take 1 tablet (10 mg total) by mouth daily. 30 tablet Dreama, Saga Balthazar  N, FNP      PDMP not reviewed this encounter.   Dreama, Briawna Carver  N, FNP 10/31/23 1220

## 2023-10-31 NOTE — Discharge Instructions (Addendum)
 Take the Zyrtec daily.  You can take the meclizine as needed for dizziness or feeling 'drunk.'  This medication may cause drowsiness or sedation as well as dry mouth.  Do not drink alcohol or drive on the meclizine. You can try the Epley maneuver at home as well, look up videos online for how to perform.   Avoid NSAIDs such as ibuprofen  with your stomach ulcers.  You can try Excedrin Migraine for your headache.  It is important that you follow-up with your primary care provider regarding ongoing management of your headaches and dizziness.  Return to clinic for new or urgent symptoms.

## 2023-10-31 NOTE — ED Triage Notes (Addendum)
 Pt c/o head swimming and dizziness for about 1 month. States it is worse with movement.  She has been seen for this and was treated for nausea and vomiting which is better.
